# Patient Record
Sex: Male | Born: 1950 | Race: White | Hispanic: No | Smoking: Current every day smoker
Health system: Southern US, Community
[De-identification: ages and names within clinical notes are randomized; demographics above are authoritative.]

## PROBLEM LIST (undated history)

## (undated) DIAGNOSIS — G8929 Other chronic pain: Secondary | ICD-10-CM

## (undated) DIAGNOSIS — M25559 Pain in unspecified hip: Secondary | ICD-10-CM

## (undated) DIAGNOSIS — M549 Dorsalgia, unspecified: Secondary | ICD-10-CM

## (undated) DIAGNOSIS — I10 Essential (primary) hypertension: Secondary | ICD-10-CM

## (undated) DIAGNOSIS — E7800 Pure hypercholesterolemia, unspecified: Secondary | ICD-10-CM

## (undated) HISTORY — DX: Essential (primary) hypertension: I10

---

## 2011-08-26 ENCOUNTER — Encounter: Payer: Self-pay | Admitting: Orthopedic Surgery

## 2011-08-26 ENCOUNTER — Ambulatory Visit (INDEPENDENT_AMBULATORY_CARE_PROVIDER_SITE_OTHER): Payer: Self-pay | Admitting: Orthopedic Surgery

## 2011-08-26 VITALS — Resp 16 | Ht 71.0 in | Wt 186.0 lb

## 2011-08-26 DIAGNOSIS — M7650 Patellar tendinitis, unspecified knee: Secondary | ICD-10-CM

## 2011-08-26 NOTE — Patient Instructions (Signed)
Use over the counter Max freeze on knee as needed  Use at least twice a day, more if needed  Use knee pads

## 2011-08-26 NOTE — Progress Notes (Signed)
Chief complaint: Knot on the LEFT knee HPI:(80) A 60 year old male complains of sharp mild pain when he walks associated with swelling.  Symptoms are gradually no trauma it seems to be worse when he is walking.  ROS:(2) Review of systems cough heartburn and easy bleeding seasonal ALLERGIES.  History reviewed. No pertinent past surgical history. Past Medical History  Diagnosis Date  . Diabetes mellitus   . HTN (hypertension)     Physical Exam(12) GENERAL: normal development   CDV: pulses are normal   Skin: normal  Lymph: nodes were not palpable/normal  Psychiatric: awake, alert and oriented  Neuro: normal sensation  MSK LEFT knee 1 Ambulation is normal not affected 2 Inspection there is a not over the tibial tubercle with tenderness mild redness, range of motion of the knee is normal.  Muscle tone strength normal.  Knee stable.  Imaging: X-ray results, normal tibiofemoral joint space fragmented tibial tubercle   Assessment: Patellar tendinitis insertional    Plan: Observation, Max Keturah Barre  Separate x-ray report 3 views LEFT knee/reason for x-ray mass LEFT knee  Normal tibiofemoral joint, normal alignment.  Normal bone.  Separate ossicle and tibial tubercle with hypertrophy of the bone  Impression normal knee with separate tibial tubercle ossicle.

## 2013-07-03 ENCOUNTER — Encounter (HOSPITAL_COMMUNITY): Payer: Self-pay

## 2013-07-03 ENCOUNTER — Emergency Department (HOSPITAL_COMMUNITY)
Admission: EM | Admit: 2013-07-03 | Discharge: 2013-07-03 | Disposition: A | Discharge status: Home / Self Care | Payer: BC Managed Care – PPO | Attending: Emergency Medicine | Admitting: Emergency Medicine

## 2013-07-03 ENCOUNTER — Emergency Department (HOSPITAL_COMMUNITY): Payer: BC Managed Care – PPO

## 2013-07-03 DIAGNOSIS — M25559 Pain in unspecified hip: Secondary | ICD-10-CM | POA: Insufficient documentation

## 2013-07-03 DIAGNOSIS — M25552 Pain in left hip: Secondary | ICD-10-CM

## 2013-07-03 DIAGNOSIS — Z7982 Long term (current) use of aspirin: Secondary | ICD-10-CM | POA: Insufficient documentation

## 2013-07-03 DIAGNOSIS — F17200 Nicotine dependence, unspecified, uncomplicated: Secondary | ICD-10-CM | POA: Insufficient documentation

## 2013-07-03 DIAGNOSIS — E119 Type 2 diabetes mellitus without complications: Secondary | ICD-10-CM | POA: Insufficient documentation

## 2013-07-03 DIAGNOSIS — IMO0002 Reserved for concepts with insufficient information to code with codable children: Secondary | ICD-10-CM | POA: Insufficient documentation

## 2013-07-03 DIAGNOSIS — E7800 Pure hypercholesterolemia, unspecified: Secondary | ICD-10-CM | POA: Insufficient documentation

## 2013-07-03 DIAGNOSIS — Z79899 Other long term (current) drug therapy: Secondary | ICD-10-CM | POA: Insufficient documentation

## 2013-07-03 DIAGNOSIS — I10 Essential (primary) hypertension: Secondary | ICD-10-CM | POA: Insufficient documentation

## 2013-07-03 DIAGNOSIS — M5416 Radiculopathy, lumbar region: Secondary | ICD-10-CM

## 2013-07-03 HISTORY — DX: Pure hypercholesterolemia, unspecified: E78.00

## 2013-07-03 MED ORDER — NAPROXEN 500 MG PO TABS: 500.0000 mg | ORAL_TABLET | Freq: Two times a day (BID) | ORAL | Status: DC

## 2013-07-03 MED ORDER — HYDROCODONE-ACETAMINOPHEN 5-325 MG PO TABS
ORAL_TABLET | ORAL | Status: AC
Start: 2013-07-03 — End: ?

## 2013-07-03 NOTE — ED Notes (Signed)
Pt reports injury to left hip several years ago.  Reports pain started to flare up 2 or 3 days ago.  Denies new injury.

## 2013-07-05 NOTE — ED Provider Notes (Signed)
History    CSN: [id number] Arrival date & time 07/03/13  4098  First MD Initiated Contact with Patient 07/03/13 1009     Chief Complaint  Patient presents with  . Hip Pain   (Consider location/radiation/quality/duration/timing/severity/associated sxs/prior Treatment) Patient is a 62 y.o. male presenting with hip pain and back pain. The history is provided by the patient.  Hip Pain This is a new problem. The current episode started in the past 7 days. The problem occurs constantly. The problem has been gradually worsening. Associated symptoms include arthralgias. Pertinent negatives include no abdominal pain, change in bowel habit, chest pain, fever, headaches, joint swelling, neck pain, numbness, rash, sore throat, urinary symptoms, vertigo, visual change, vomiting or weakness. The symptoms are aggravated by bending, twisting, walking and standing. He has tried nothing for the symptoms. The treatment provided no relief.  Back Pain Location:  Lumbar spine Quality:  Aching Radiates to:  L thigh Pain severity:  Moderate Pain is:  Same all the time Onset quality:  Gradual Duration:  3 days Timing:  Constant Progression:  Worsening Chronicity:  Recurrent Context: twisting   Context: not recent injury   Relieved by:  Bed rest Worsened by:  Ambulation, bending, twisting, standing and movement Ineffective treatments:  NSAIDs Associated symptoms: leg pain   Associated symptoms: no abdominal pain, no abdominal swelling, no bladder incontinence, no bowel incontinence, no chest pain, no dysuria, no fever, no headaches, no numbness, no paresthesias, no pelvic pain, no perianal numbness, no tingling and no weakness    Past Medical History  Diagnosis Date  . Diabetes mellitus   . HTN (hypertension)   . Hypercholesterolemia    History reviewed. No pertinent past surgical history. Family History  Problem Relation Age of Onset  . Arthritis    . Diabetes     History  Substance Use  Topics  . Smoking status: Current Every Day Smoker -- 2.00 packs/day    Types: Cigarettes  . Smokeless tobacco: Not on file  . Alcohol Use: No    Review of Systems  Constitutional: Negative for fever.  HENT: Negative for sore throat and neck pain.   Respiratory: Negative for shortness of breath.   Cardiovascular: Negative for chest pain.  Gastrointestinal: Negative for vomiting, abdominal pain, constipation, change in bowel habit and bowel incontinence.  Genitourinary: Negative for bladder incontinence, dysuria, hematuria, flank pain, decreased urine volume, difficulty urinating and pelvic pain.       No perineal numbness or incontinence of urine or feces  Musculoskeletal: Positive for back pain and arthralgias. Negative for joint swelling.  Skin: Negative for rash.  Neurological: Negative for vertigo, tingling, weakness, numbness, headaches and paresthesias.  All other systems reviewed and are negative.    Allergies  Review of patient's allergies indicates no known allergies.  Home Medications   Current Outpatient Rx  Name  Route  Sig  Dispense  Refill  . aspirin 81 MG chewable tablet   Oral   Chew 81 mg by mouth daily.          . enalapril (VASOTEC) 20 MG tablet   Oral   Take 20 mg by mouth daily.         . hydrochlorothiazide 25 MG tablet   Oral   Take 25 mg by mouth daily.           . metFORMIN (GLUMETZA) 500 MG (MOD) 24 hr tablet   Oral   Take 500 mg by mouth 2 (two) times daily with a  meal.          . Multiple Vitamin (MULTIVITAMIN WITH MINERALS) TABS   Oral   Take 1 tablet by mouth daily.         . pravastatin (PRAVACHOL) 40 MG tablet   Oral   Take 40 mg by mouth daily.           Marland Kitchen HYDROcodone-acetaminophen (NORCO/VICODIN) 5-325 MG per tablet      Take one-two tabs po q 4-6 hrs prn pain   15 tablet   0   . naproxen (NAPROSYN) 500 MG tablet   Oral   Take 1 tablet (500 mg total) by mouth 2 (two) times daily with a meal.   20 tablet   0     BP 146/82  Pulse 63  Temp(Src) 98.4 F (36.9 C) (Oral)  Resp 16  Ht 5\' 10"  (1.778 m)  Wt 184 lb (83.462 kg)  BMI 26.4 kg/m2  SpO2 98% Physical Exam  Nursing note and vitals reviewed. Constitutional: He is oriented to person, place, and time. He appears well-developed and well-nourished. No distress.  HENT:  Head: Normocephalic and atraumatic.  Neck: Normal range of motion. Neck supple.  Cardiovascular: Normal rate, regular rhythm, normal heart sounds and intact distal pulses.   No murmur heard. Pulmonary/Chest: Effort normal and breath sounds normal. No respiratory distress.  Musculoskeletal: He exhibits tenderness. He exhibits no edema.       Lumbar back: He exhibits tenderness and pain. He exhibits normal range of motion, no swelling, no deformity, no laceration and normal pulse.  ttp of the left lumbar paraspinal muscles and SI joint.  No spinal tenderness.  DP pulses are brisk and symmetrical.  Distal sensation intact.  Hip Flexors/Extensors are intact, but pain to left hip reproduced with internal rotation.  SLR is negative bilaterally  Neurological: He is alert and oriented to person, place, and time. No cranial nerve deficit or sensory deficit. He exhibits normal muscle tone. Coordination and gait normal.  Reflex Scores:      Patellar reflexes are 2+ on the right side and 2+ on the left side.      Achilles reflexes are 2+ on the right side and 2+ on the left side. Skin: Skin is warm and dry.    ED Course  Procedures (including critical care time) Labs Reviewed - No data to display No results found.   Dg Lumbar Spine Complete  07/03/2013   *RADIOLOGY REPORT*  Clinical Data: Hip pain  LUMBAR SPINE - COMPLETE 4+ VIEW  Comparison: None.  Findings: Five views of the lumbar spine submitted.  There is disc space flattening with anterior spurring at T10-T11 and T11-T12 level.  Mild anterior spurring upper endplate of the L3 and L4 vertebral body.  Disc space flattening with mild  anterior spurring at L4-L5 and L5-S1 level.  Mild facet degenerative changes at L5 level. No acute fracture or subluxation.  IMPRESSION: No acute fracture or subluxation.  Degenerative changes as described above.   Original Report Authenticated By: Natasha Mead, M.D.   Dg Hip Complete Left  07/03/2013   *RADIOLOGY REPORT*  Clinical Data: Hip pain.  LEFT HIP - COMPLETE 2+ VIEW  Comparison: No priors.  Findings: AP view of the pelvis and AP and lateral views of the left hip demonstrate no acute displaced fracture, subluxation or dislocation.  There is mild joint space narrowing, subchondral sclerosis and osteophyte formation in the hip joints bilaterally, compatible with mild osteoarthritis.  IMPRESSION: 1.  No acute  radiographic abnormality of the bony pelvis or the left hip. 2.  Mild bilateral hip joint osteoarthritis.   Original Report Authenticated By: Trudie Reed, M.D.   1. Hip pain, left   2. Lumbar radicular pain     MDM    Patient has ttp of the left lumbar paraspinal muscles and left SI joint.  No focal neuro deficits on exam.  Ambulates with a steady gait.   Doubt emergent neurological or infectious process.    Pt agrees to rest, ice/heat and close f/u with his PMD or with orthopedics.    Mariel Lukins L. Mayvis Agudelo, PA-C 07/05/13 1313

## 2013-07-07 NOTE — ED Provider Notes (Signed)
Medical screening examination/treatment/procedure(s) were performed by non-physician practitioner and as supervising physician I was immediately available for consultation/collaboration.   Joya Gaskins, MD 07/07/13 [phone]

## 2016-07-20 ENCOUNTER — Emergency Department (HOSPITAL_COMMUNITY)
Admission: EM | Admit: 2016-07-20 | Discharge: 2016-07-20 | Disposition: A | Discharge status: Home / Self Care | Payer: BLUE CROSS/BLUE SHIELD | Attending: Emergency Medicine | Admitting: Emergency Medicine

## 2016-07-20 ENCOUNTER — Emergency Department (HOSPITAL_COMMUNITY): Payer: BLUE CROSS/BLUE SHIELD

## 2016-07-20 ENCOUNTER — Encounter (HOSPITAL_COMMUNITY): Payer: Self-pay | Admitting: Emergency Medicine

## 2016-07-20 DIAGNOSIS — I10 Essential (primary) hypertension: Secondary | ICD-10-CM | POA: Insufficient documentation

## 2016-07-20 DIAGNOSIS — S39012A Strain of muscle, fascia and tendon of lower back, initial encounter: Secondary | ICD-10-CM | POA: Diagnosis not present

## 2016-07-20 DIAGNOSIS — Y929 Unspecified place or not applicable: Secondary | ICD-10-CM | POA: Insufficient documentation

## 2016-07-20 DIAGNOSIS — F17210 Nicotine dependence, cigarettes, uncomplicated: Secondary | ICD-10-CM | POA: Diagnosis not present

## 2016-07-20 DIAGNOSIS — Y939 Activity, unspecified: Secondary | ICD-10-CM | POA: Diagnosis not present

## 2016-07-20 DIAGNOSIS — Z7984 Long term (current) use of oral hypoglycemic drugs: Secondary | ICD-10-CM | POA: Insufficient documentation

## 2016-07-20 DIAGNOSIS — Y990 Civilian activity done for income or pay: Secondary | ICD-10-CM | POA: Diagnosis not present

## 2016-07-20 DIAGNOSIS — E119 Type 2 diabetes mellitus without complications: Secondary | ICD-10-CM | POA: Insufficient documentation

## 2016-07-20 DIAGNOSIS — S3992XA Unspecified injury of lower back, initial encounter: Secondary | ICD-10-CM | POA: Diagnosis present

## 2016-07-20 DIAGNOSIS — Z7982 Long term (current) use of aspirin: Secondary | ICD-10-CM | POA: Insufficient documentation

## 2016-07-20 MED ORDER — PREDNISONE 10 MG PO TABS: ORAL_TABLET | ORAL | 0 refills | Status: DC

## 2016-07-20 MED ORDER — PREDNISONE 50 MG PO TABS
60.0000 mg | ORAL_TABLET | Freq: Once | ORAL | Status: AC
  Administered 2016-07-20: 60 mg via ORAL
  Filled 2016-07-20: qty 1

## 2016-07-20 MED ORDER — CYCLOBENZAPRINE HCL 10 MG PO TABS
10.0000 mg | ORAL_TABLET | Freq: Once | ORAL | Status: AC
  Administered 2016-07-20: 10 mg via ORAL
  Filled 2016-07-20: qty 1

## 2016-07-20 MED ORDER — CYCLOBENZAPRINE HCL 5 MG PO TABS: 5.0000 mg | ORAL_TABLET | Freq: Three times a day (TID) | ORAL | 0 refills | Status: AC | PRN

## 2016-07-20 NOTE — ED Notes (Signed)
Patient returned from X-ray 

## 2016-07-20 NOTE — Discharge Instructions (Signed)
Take your next dose of prednisone tomorrow morning.  Use the the other medicines as directed.  Continue taking your home pain medicine.   Avoid lifting,  Bending,  Twisting or any other activity that worsens your pain over the next week.  Apply an  icepack  to your lower back for 10-15 minutes every 2 hours for the next 2 days.  You should get rechecked if your symptoms are not better over the next 5 days,  Or you develop increased pain,  Weakness in your leg(s) or loss of bladder or bowel function - these are symptoms of a worse injury.

## 2016-07-20 NOTE — ED Triage Notes (Signed)
Pt reports lower back pain after crawling at his job over the weekend.  Pt states he feels like his legs are sometimes going to give out.

## 2016-07-20 NOTE — ED Notes (Signed)
Patient transported to X-ray 

## 2016-07-23 NOTE — ED Provider Notes (Signed)
[facility]PT Provider Note   CSN: [id number] Arrival date & time: 07/20/16  1108  First Provider Contact:  First MD Initiated Contact with Patient 07/20/16 1124        History   Chief Complaint Chief Complaint  Patient presents with  . Back Pain    HPI Caleb Lynch is a 65 y.o. male.  Caleb Lynch is a 65 y.o. Male presenting with worsening of his intermittent low back pain present for the past 3 days after having to crawl underneath a trailer (works [employer]) 4 days ago.  He endorses intermittent low back pain with this event worse than normal.  He has radiation of pain into his left posterior buttock and with certain movements has sharp stabs of pain that makes his legs want to buckle. He denies weakness or numbness in his legs and has had no urinary or fecal incontinence or retention.  He has no cancer history, denies substance abuse.  He has taken hydrocodone (takes for chronic c spine pain)  and rested without relief of pain.     The history is provided by the patient.    Past Medical History:  Diagnosis Date  . Diabetes mellitus   . HTN (hypertension)   . Hypercholesterolemia     Patient Active Problem List   Diagnosis Date Noted  . Patellar tendinitis 08/26/2011    History reviewed. No pertinent surgical history.     Home Medications    Prior to Admission medications   Medication Sig Start Date End Date Taking? Authorizing Provider  aspirin 81 MG chewable tablet Chew 81 mg by mouth daily.     Historical Provider, MD  cyclobenzaprine (FLEXERIL) 5 MG tablet Take 1 tablet (5 mg total) by mouth 3 (three) times daily as needed for muscle spasms. 07/20/16   Burgess Amor, PA-C  enalapril (VASOTEC) 20 MG tablet Take 20 mg by mouth daily.    Historical Provider, MD  hydrochlorothiazide 25 MG tablet Take 25 mg by mouth daily.      Historical Provider, MD  HYDROcodone-acetaminophen (NORCO/VICODIN) 5-325 MG per tablet Take one-two tabs po q 4-6 hrs  prn pain 07/03/13   Tammy Triplett, PA-C  metFORMIN (GLUMETZA) 500 MG (MOD) 24 hr tablet Take 500 mg by mouth 2 (two) times daily with a meal.     Historical Provider, MD  Multiple Vitamin (MULTIVITAMIN WITH MINERALS) TABS Take 1 tablet by mouth daily.    Historical Provider, MD  naproxen (NAPROSYN) 500 MG tablet Take 1 tablet (500 mg total) by mouth 2 (two) times daily with a meal. 07/03/13   Tammy Triplett, PA-C  pravastatin (PRAVACHOL) 40 MG tablet Take 40 mg by mouth daily.      Historical Provider, MD  predniSONE (DELTASONE) 10 MG tablet 6, 5, 4, 3, 2 then 1 tablet by mouth daily for 6 days total. 07/21/16   Burgess Amor, PA-C    Family History Family History  Problem Relation Age of Onset  . Arthritis    . Diabetes      Social History Social History  Substance Use Topics  . Smoking status: Current Every Day Smoker    Packs/day: 2.00    Types: Cigarettes  . Smokeless tobacco: Not on file  . Alcohol use No     Allergies   Review of patient's allergies indicates no known allergies.   Review of Systems Review of Systems  Constitutional: Negative for fever.  Respiratory: Negative for shortness of breath.   Cardiovascular: Negative for  chest pain and leg swelling.  Gastrointestinal: Negative for abdominal distention, abdominal pain and constipation.  Genitourinary: Negative for difficulty urinating, dysuria, flank pain, frequency and urgency.  Musculoskeletal: Positive for back pain. Negative for gait problem and joint swelling.  Skin: Negative for rash.  Neurological: Negative for weakness and numbness.     Physical Exam Updated Vital Signs BP 155/82 (BP Location: Left Arm)   Pulse 61   Temp 98.2 F (36.8 C) (Oral)   Resp 16   Ht  (1.778 m)   Wt 83 kg   SpO2 100%   BMI 26.26 kg/m   Physical Exam  Constitutional: He appears well-developed and well-nourished.  HENT:  Head: Normocephalic.  Eyes: Conjunctivae are normal.  Neck: Normal range of motion. Neck  supple.  Cardiovascular: Normal rate and intact distal pulses.   Pedal pulses normal.  Pulmonary/Chest: Effort normal.  Abdominal: Soft. Bowel sounds are normal. He exhibits no distension, no abdominal bruit and no pulsatile midline mass.  Musculoskeletal: Normal range of motion. He exhibits no edema.       Lumbar back: He exhibits tenderness. He exhibits no swelling, no edema and no spasm.  Neurological: He is alert. He has normal strength. He displays no atrophy and no tremor. No sensory deficit. Gait normal.  Reflex Scores:      Patellar reflexes are 2+ on the right side and 2+ on the left side. No strength deficit noted in hip and knee flexor and extensor muscle groups.  Ankle flexion and extension intact.  Skin: Skin is warm and dry.  Psychiatric: He has a normal mood and affect.  Nursing note and vitals reviewed.    ED Treatments / Results  Labs (all labs ordered are listed, but only abnormal results are displayed) Labs Reviewed - No data to display  EKG  EKG Interpretation None       Radiology  EXAM: LUMBAR SPINE - COMPLETE 4+ VIEW  COMPARISON: 07/03/2013  FINDINGS: There are 5 non rib-bearing lumbar type vertebral bodies. Vertebral alignment is normal. Vertebral body heights are preserved. Moderate disc space narrowing is again seen at L5-S1 with mild narrowing at T11-12 and L4-5. Moderate marginal osteophytosis is present at multiple levels. No pars defects are identified. Facet arthrosis is noted at L5-S1. Abdominal aortic atherosclerosis is present.  IMPRESSION: 1. No evidence of acute osseous abnormality. 2. Similar appearance of multilevel disc degeneration.   Electronically Signed By: Sebastian Ache M.D. On: 07/20/2016 12:35 Procedures Procedures (including critical care time)  Medications Ordered in ED Medications  predniSONE (DELTASONE) tablet 60 mg (60 mg Oral Given 07/20/16 1306)  cyclobenzaprine (FLEXERIL) tablet 10 mg (10 mg Oral Given  07/20/16 1306)     Initial Impression / Assessment and Plan / ED Course  I have reviewed the triage vital signs and the nursing notes.  Pertinent labs & imaging results that were available during my care of the patient were reviewed by me and considered in my medical decision making (see chart for details).  Clinical Course    No neuro deficit on exam or by history to suggest emergent or surgical presentation.  Imaging reviewed and discussed with patient.  Advised heat tx, continue hydrocodone, activity as tolerated, flexeril and prednisone taper given.  Also discussed worsened sx that should prompt immediate re-evaluation including distal weakness, bowel/bladder retention/incontinence.        Final Clinical Impressions(s) / ED Diagnoses   Final diagnoses:  Low back strain, initial encounter    New Prescriptions Discharge Medication  List as of 07/20/2016  1:00 PM    START taking these medications   Details  cyclobenzaprine (FLEXERIL) 5 MG tablet Take 1 tablet (5 mg total) by mouth 3 (three) times daily as needed for muscle spasms., Starting Tue 07/20/2016, Print    predniSONE (DELTASONE) 10 MG tablet 6, 5, 4, 3, 2 then 1 tablet by mouth daily for 6 days total., Print         Burgess Amor, PA-C 07/23/16 1215    Mancel Bale, MD 07/24/16 0030

## 2018-03-20 ENCOUNTER — Other Ambulatory Visit (HOSPITAL_COMMUNITY): Payer: Self-pay | Admitting: Family Medicine

## 2018-03-20 ENCOUNTER — Ambulatory Visit (HOSPITAL_COMMUNITY)
Admission: RE | Admit: 2018-03-20 | Discharge: 2018-03-20 | Disposition: A | Discharge status: Home / Self Care | Payer: Medicare Other | Source: Ambulatory Visit | Attending: Family Medicine | Admitting: Family Medicine

## 2018-03-20 DIAGNOSIS — G8929 Other chronic pain: Secondary | ICD-10-CM | POA: Diagnosis present

## 2018-03-20 DIAGNOSIS — I700 Atherosclerosis of aorta: Secondary | ICD-10-CM | POA: Insufficient documentation

## 2018-03-20 DIAGNOSIS — M5136 Other intervertebral disc degeneration, lumbar region: Secondary | ICD-10-CM | POA: Diagnosis not present

## 2018-05-17 ENCOUNTER — Other Ambulatory Visit: Payer: Self-pay | Admitting: Family Medicine

## 2018-05-17 DIAGNOSIS — M25552 Pain in left hip: Principal | ICD-10-CM

## 2018-05-17 DIAGNOSIS — M545 Low back pain: Secondary | ICD-10-CM

## 2018-05-17 DIAGNOSIS — M25551 Pain in right hip: Secondary | ICD-10-CM

## 2018-05-21 ENCOUNTER — Ambulatory Visit
Admission: RE | Admit: 2018-05-21 | Discharge: 2018-05-21 | Disposition: A | Discharge status: Home / Self Care | Payer: Medicare Other | Source: Ambulatory Visit | Attending: Family Medicine | Admitting: Family Medicine

## 2018-05-21 DIAGNOSIS — M545 Low back pain: Secondary | ICD-10-CM

## 2018-05-21 DIAGNOSIS — M25551 Pain in right hip: Secondary | ICD-10-CM

## 2018-05-21 DIAGNOSIS — M25552 Pain in left hip: Principal | ICD-10-CM

## 2018-10-21 ENCOUNTER — Emergency Department (HOSPITAL_COMMUNITY): Payer: Medicare Other

## 2018-10-21 ENCOUNTER — Other Ambulatory Visit: Payer: Self-pay

## 2018-10-21 ENCOUNTER — Inpatient Hospital Stay (HOSPITAL_COMMUNITY)
Admission: EM | Admit: 2018-10-21 | Discharge: 2018-10-24 | DRG: 351 | Disposition: A | Discharge status: Home / Self Care | Payer: Medicare Other | Attending: Nephrology | Admitting: Nephrology

## 2018-10-21 ENCOUNTER — Encounter (HOSPITAL_COMMUNITY): Payer: Self-pay | Admitting: Emergency Medicine

## 2018-10-21 DIAGNOSIS — Z720 Tobacco use: Secondary | ICD-10-CM | POA: Diagnosis not present

## 2018-10-21 DIAGNOSIS — Z79899 Other long term (current) drug therapy: Secondary | ICD-10-CM

## 2018-10-21 DIAGNOSIS — N2889 Other specified disorders of kidney and ureter: Secondary | ICD-10-CM | POA: Diagnosis not present

## 2018-10-21 DIAGNOSIS — N12 Tubulo-interstitial nephritis, not specified as acute or chronic: Secondary | ICD-10-CM | POA: Diagnosis present

## 2018-10-21 DIAGNOSIS — E7800 Pure hypercholesterolemia, unspecified: Secondary | ICD-10-CM | POA: Diagnosis present

## 2018-10-21 DIAGNOSIS — R93429 Abnormal radiologic findings on diagnostic imaging of unspecified kidney: Secondary | ICD-10-CM | POA: Diagnosis not present

## 2018-10-21 DIAGNOSIS — I10 Essential (primary) hypertension: Secondary | ICD-10-CM | POA: Diagnosis present

## 2018-10-21 DIAGNOSIS — K4030 Unilateral inguinal hernia, with obstruction, without gangrene, not specified as recurrent: Secondary | ICD-10-CM | POA: Diagnosis present

## 2018-10-21 DIAGNOSIS — F17210 Nicotine dependence, cigarettes, uncomplicated: Secondary | ICD-10-CM | POA: Diagnosis present

## 2018-10-21 DIAGNOSIS — Z7984 Long term (current) use of oral hypoglycemic drugs: Secondary | ICD-10-CM

## 2018-10-21 DIAGNOSIS — Z7982 Long term (current) use of aspirin: Secondary | ICD-10-CM | POA: Diagnosis not present

## 2018-10-21 DIAGNOSIS — E278 Other specified disorders of adrenal gland: Secondary | ICD-10-CM | POA: Diagnosis not present

## 2018-10-21 DIAGNOSIS — E119 Type 2 diabetes mellitus without complications: Secondary | ICD-10-CM | POA: Diagnosis present

## 2018-10-21 DIAGNOSIS — E876 Hypokalemia: Secondary | ICD-10-CM | POA: Diagnosis present

## 2018-10-21 HISTORY — DX: Other chronic pain: G89.29

## 2018-10-21 HISTORY — DX: Pain in unspecified hip: M25.559

## 2018-10-21 HISTORY — DX: Dorsalgia, unspecified: M54.9

## 2018-10-21 LAB — CBC
HCT: 41.3 % (ref 39.0–52.0)
Hemoglobin: 13.2 g/dL (ref 13.0–17.0)
MCH: 29 pg (ref 26.0–34.0)
MCHC: 32 g/dL (ref 30.0–36.0)
MCV: 90.8 fL (ref 80.0–100.0)
PLATELETS: 558 10*3/uL — AB (ref 150–400)
RBC: 4.55 MIL/uL (ref 4.22–5.81)
RDW: 13.1 % (ref 11.5–15.5)
WBC: 21.8 10*3/uL — ABNORMAL HIGH (ref 4.0–10.5)
nRBC: 0 % (ref 0.0–0.2)

## 2018-10-21 LAB — URINALYSIS, ROUTINE W REFLEX MICROSCOPIC
Bacteria, UA: NONE SEEN
Bilirubin Urine: NEGATIVE
GLUCOSE, UA: NEGATIVE mg/dL
Hgb urine dipstick: NEGATIVE
Ketones, ur: NEGATIVE mg/dL
Nitrite: NEGATIVE
PROTEIN: NEGATIVE mg/dL
Specific Gravity, Urine: 1.03 (ref 1.005–1.030)
pH: 6 (ref 5.0–8.0)

## 2018-10-21 LAB — COMPREHENSIVE METABOLIC PANEL
ALBUMIN: 3.1 g/dL — AB (ref 3.5–5.0)
ALK PHOS: 69 U/L (ref 38–126)
ALT: 16 U/L (ref 0–44)
AST: 15 U/L (ref 15–41)
Anion gap: 11 (ref 5–15)
BUN: 22 mg/dL (ref 8–23)
CALCIUM: 8.7 mg/dL — AB (ref 8.9–10.3)
CO2: 22 mmol/L (ref 22–32)
CREATININE: 1.07 mg/dL (ref 0.61–1.24)
Chloride: 102 mmol/L (ref 98–111)
GFR calc Af Amer: >60 mL/min (ref >60)
GFR calc non Af Amer: >60 mL/min (ref >60)
GLUCOSE: 131 mg/dL — AB (ref 70–99)
Potassium: 3 mmol/L — ABNORMAL LOW (ref 3.5–5.1)
SODIUM: 135 mmol/L (ref 135–145)
Total Bilirubin: 0.4 mg/dL (ref 0.3–1.2)
Total Protein: 6.4 g/dL — ABNORMAL LOW (ref 6.5–8.1)

## 2018-10-21 LAB — DIFFERENTIAL
ABS IMMATURE GRANULOCYTES: 0.14 10*3/uL — AB (ref 0.00–0.07)
Basophils Absolute: 0 10*3/uL (ref 0.0–0.1)
Basophils Relative: 0 %
EOS PCT: 0 %
Eosinophils Absolute: 0 10*3/uL (ref 0.0–0.5)
Immature Granulocytes: 1 %
LYMPHS PCT: 6 %
Lymphs Abs: 1.2 10*3/uL (ref 0.7–4.0)
MONOS PCT: 4 %
Monocytes Absolute: 0.9 10*3/uL (ref 0.1–1.0)
NEUTROS ABS: 19 10*3/uL — AB (ref 1.7–7.7)
Neutrophils Relative %: 89 %

## 2018-10-21 LAB — GLUCOSE, CAPILLARY
Glucose-Capillary: 106 mg/dL — ABNORMAL HIGH (ref 70–99)
Glucose-Capillary: 93 mg/dL (ref 70–99)

## 2018-10-21 LAB — LIPASE, BLOOD: Lipase: 21 U/L (ref 11–51)

## 2018-10-21 MED ORDER — KETOROLAC TROMETHAMINE 15 MG/ML IJ SOLN
15.0000 mg | Freq: Four times a day (QID) | INTRAMUSCULAR | Status: DC | PRN
  Administered 2018-10-21 – 2018-10-22 (×5): 15 mg via INTRAVENOUS
  Filled 2018-10-21 (×5): qty 1

## 2018-10-21 MED ORDER — ONDANSETRON HCL 4 MG PO TABS: 4.0000 mg | ORAL_TABLET | Freq: Four times a day (QID) | ORAL | Status: DC | PRN

## 2018-10-21 MED ORDER — PIPERACILLIN-TAZOBACTAM 3.375 G IVPB
3.3750 g | Freq: Three times a day (TID) | INTRAVENOUS | Status: DC
  Administered 2018-10-21 – 2018-10-24 (×8): 3.375 g via INTRAVENOUS
  Filled 2018-10-21 (×8): qty 50

## 2018-10-21 MED ORDER — ACETAMINOPHEN 650 MG RE SUPP: 650.0000 mg | Freq: Four times a day (QID) | RECTAL | Status: DC | PRN

## 2018-10-21 MED ORDER — ONDANSETRON HCL 4 MG/2ML IJ SOLN: 4.0000 mg | Freq: Four times a day (QID) | INTRAMUSCULAR | Status: DC | PRN

## 2018-10-21 MED ORDER — ENOXAPARIN SODIUM 40 MG/0.4ML ~~LOC~~ SOLN
40.0000 mg | SUBCUTANEOUS | Status: DC
  Administered 2018-10-21: 40 mg via SUBCUTANEOUS
  Filled 2018-10-21: qty 0.4

## 2018-10-21 MED ORDER — ACETAMINOPHEN 325 MG PO TABS: 650.0000 mg | ORAL_TABLET | Freq: Four times a day (QID) | ORAL | Status: DC | PRN

## 2018-10-21 MED ORDER — SODIUM CHLORIDE 0.9 % IV BOLUS: 1000.0000 mL | Freq: Once | INTRAVENOUS | Status: DC

## 2018-10-21 MED ORDER — POTASSIUM CHLORIDE IN NACL 40-0.9 MEQ/L-% IV SOLN
INTRAVENOUS | Status: DC
  Administered 2018-10-21 – 2018-10-22 (×2): 100 mL/h via INTRAVENOUS
  Administered 2018-10-22 – 2018-10-23 (×2): 75 mL/h via INTRAVENOUS

## 2018-10-21 MED ORDER — ONDANSETRON HCL 4 MG/2ML IJ SOLN
4.0000 mg | Freq: Once | INTRAMUSCULAR | Status: AC | PRN
  Administered 2018-10-21: 4 mg via INTRAVENOUS
  Filled 2018-10-21: qty 2

## 2018-10-21 MED ORDER — SODIUM CHLORIDE 0.9 % IV BOLUS
1000.0000 mL | Freq: Once | INTRAVENOUS | Status: AC
  Administered 2018-10-21: 1000 mL via INTRAVENOUS

## 2018-10-21 MED ORDER — INSULIN ASPART 100 UNIT/ML ~~LOC~~ SOLN: 0.0000 [IU] | SUBCUTANEOUS | Status: DC

## 2018-10-21 MED ORDER — PIPERACILLIN-TAZOBACTAM 3.375 G IVPB 30 MIN
3.3750 g | Freq: Once | INTRAVENOUS | Status: AC
  Administered 2018-10-21: 3.375 g via INTRAVENOUS
  Filled 2018-10-21: qty 50

## 2018-10-21 MED ORDER — HYDRALAZINE HCL 20 MG/ML IJ SOLN: 10.0000 mg | Freq: Four times a day (QID) | INTRAMUSCULAR | Status: DC | PRN

## 2018-10-21 MED ORDER — IOPAMIDOL (ISOVUE-300) INJECTION 61%
100.0000 mL | Freq: Once | INTRAVENOUS | Status: AC | PRN
  Administered 2018-10-21: 100 mL via INTRAVENOUS

## 2018-10-21 NOTE — Progress Notes (Signed)
Pharmacy Antibiotic Note  Caleb Lynch is a 67 y.o. male admitted on 10/21/2018 with  intra-abdominal infection.  Pharmacy has been consulted for Zosyn dosing.   Plan: Start Zosyn 3.375g IV q8h (4-hr infusion)  Height: 5\' 10"  (177.8 cm) Weight: 156 lb (70.8 kg) IBW/kg (Calculated) : 73  Temp (24hrs), Avg:98.2 F (36.8 C), Min:98.2 F (36.8 C), Max:98.2 F (36.8 C)  Recent Labs  Lab 10/21/18 1127  WBC 21.8*  CREATININE 1.07    Estimated Creatinine Clearance: 68 mL/min (by C-G formula based on SCr of 1.07 mg/dL).    No Known Allergies  Antimicrobials this admission:   Zosyn   11/2 >>       Microbiology results:  11/2 UCx: in progress    Thank you for allowing pharmacy to be a part of this patient's care.  Tama High 10/21/2018 6:46 PM

## 2018-10-21 NOTE — H&P (Signed)
History and Physical    Caleb Lynch ZOX:[id number] DOB: 1951/08/17 DOA: 10/21/2018  PCP: Oval Linsey, MD  Patient coming from: Home  I have personally briefly reviewed patient's old medical records in [facility]  Chief Complaint: Vomiting  HPI: Caleb Lynch is a 67 y.o. male with medical history significant of hypertension, diabetes, left inguinal hernia, who is been feeling nauseous and vomiting intermittently for the past several weeks.  For the past 3 to 4 days, he has had persistent vomiting.  He is been able to keep anything down by mouth.  He basically thought was up everything that he eats.  His last bowel movement was several days ago.  He is passing flatus.  He has not had any fever.  No shortness of breath or cough.  No dysuria.  No flank pain.  He does describe some discomfort in his lower abdomen.  He denies any previous abdominal surgeries.  ED Course: Patient was evaluated in the emergency room for vitals were noted to be stable.  WBC count noted to be elevated at 21.  Urinalysis shows 11-20 WBCs with no bacteria.  CT scan of the abdomen and pelvis indicated high-grade mechanical obstruction of the descending colon secondary to entering and exiting left inguinal hernia.  Obstruction occurs within the hernia sac.  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  Review of Systems: As per HPI otherwise 10 point review of systems negative.    Past Medical History:  Diagnosis Date  . Chronic back pain   . Chronic hip pain   . Diabetes mellitus   . HTN (hypertension)   . Hypercholesterolemia     History reviewed. No pertinent surgical history.  Social History:  reports that he has been smoking cigarettes. He has a 40.00 pack-year smoking history. He has never used smokeless tobacco. He reports that he  does not drink alcohol or use drugs.  No Known Allergies  Family History  Problem Relation Age of Onset  . Arthritis Unknown   . Diabetes Unknown     Prior to Admission medications   Medication Sig Start Date End Date Taking? Authorizing Provider  amLODipine (NORVASC) 10 MG tablet Take 1 tablet by mouth daily. 08/19/18  Yes [provider]  aspirin 81 MG chewable tablet Chew 81 mg by mouth daily.    Yes [provider]  HYDROcodone-acetaminophen (NORCO/VICODIN) 5-325 MG per tablet Take one-two tabs po q 4-6 hrs prn pain 07/03/13  Yes Triplett, Tammy, PA-C  lisinopril (PRINIVIL,ZESTRIL) 20 MG tablet Take 1 tablet by mouth daily. 10/07/18  Yes [provider]  metFORMIN (GLUMETZA) 500 MG (MOD) 24 hr tablet Take 500 mg by mouth 2 (two) times daily with a meal.    Yes [provider]  Multiple Vitamin (MULTIVITAMIN WITH MINERALS) TABS Take 1 tablet by mouth daily.   Yes [provider]  naproxen (NAPROSYN) 500 MG tablet Take 1 tablet (500 mg total) by mouth 2 (two) times daily with a meal. 07/03/13  Yes Triplett, Tammy, PA-C  pravastatin (PRAVACHOL) 40 MG tablet Take 40 mg by mouth daily.     Yes [provider]  cyclobenzaprine (FLEXERIL) 5 MG tablet Take 1 tablet (5 mg total) by mouth 3 (three) times daily as needed for muscle spasms. Patient not taking: Reported on 10/21/2018 07/20/16   Burgess Amor, PA-C  enalapril (VASOTEC)  20 MG tablet Take 20 mg by mouth daily.    [provider]  predniSONE (DELTASONE) 10 MG tablet 6, 5, 4, 3, 2 then 1 tablet by mouth daily for 6 days total. Patient not taking: Reported on 10/21/2018 07/21/16   Burgess Amor, PA-C    Physical Exam: Vitals:   10/21/18 1056 10/21/18 1603  BP: (!) 153/96 (!) 148/91  Pulse: 83 78  Resp: 14 16  Temp: 98.2 F (36.8 C) 98.2 F (36.8 C)  TempSrc: Oral Oral  SpO2: 98% 95%  Weight: 78.9 kg 70.8 kg  Height: 5\' 10"  (1.778 m) 5\' 10"  (1.778 m)    Constitutional: NAD,  calm, comfortable Eyes: PERRL, lids and conjunctivae normal ENMT: Mucous membranes are moist. Posterior pharynx clear of any exudate or lesions.Normal dentition.  Neck: normal, supple, no masses, no thyromegaly Respiratory: clear to auscultation bilaterally, no wheezing, no crackles. Normal respiratory effort. No accessory muscle use.  Cardiovascular: Regular rate and rhythm, no murmurs / rubs / gallops. No extremity edema. 2+ pedal pulses. No carotid bruits.  Abdomen: no tenderness, no masses palpated. No hepatosplenomegaly. Bowel sounds positive.  Left inguinal hernia, not reducible, mildly tender on palpation Musculoskeletal: no clubbing / cyanosis. No joint deformity upper and lower extremities. Good ROM, no contractures. Normal muscle tone.  Skin: no rashes, lesions, ulcers. No induration Neurologic: CN 2-12 grossly intact. Sensation intact, DTR normal. Strength 5/5 in all 4.  Psychiatric: Normal judgment and insight. Alert and oriented x 3. Normal mood.    Labs on Admission: I have personally reviewed following labs and imaging studies  CBC: Recent Labs  Lab 10/21/18 1127 10/21/18 1129  WBC 21.8*  --   NEUTROABS  --  19.0*  HGB 13.2  --   HCT 41.3  --   MCV 90.8  --   PLT 558*  --    Basic Metabolic Panel: Recent Labs  Lab 10/21/18 1127  NA 135  K 3.0*  CL 102  CO2 22  GLUCOSE 131*  BUN 22  CREATININE 1.07  CALCIUM 8.7*   GFR: Estimated Creatinine Clearance: 68 mL/min (by C-G formula based on SCr of 1.07 mg/dL). Liver Function Tests: Recent Labs  Lab 10/21/18 1127  AST 15  ALT 16  ALKPHOS 69  BILITOT 0.4  PROT 6.4*  ALBUMIN 3.1*   Recent Labs  Lab 10/21/18 1127  LIPASE 21   No results for input(s): AMMONIA in the last 168 hours. Coagulation Profile: No results for input(s): INR, PROTIME in the last 168 hours. Cardiac Enzymes: No results for input(s): CKTOTAL, CKMB, CKMBINDEX, TROPONINI in the last 168 hours. BNP (last 3 results) No results for  input(s): PROBNP in the last 8760 hours. HbA1C: No results for input(s): HGBA1C in the last 72 hours. CBG: Recent Labs  Lab 10/21/18 1640  GLUCAP 106*   Lipid Profile: No results for input(s): CHOL, HDL, LDLCALC, TRIG, CHOLHDL, LDLDIRECT in the last 72 hours. Thyroid Function Tests: No results for input(s): TSH, T4TOTAL, FREET4, T3FREE, THYROIDAB in the last 72 hours. Anemia Panel: No results for input(s): VITAMINB12, FOLATE, FERRITIN, TIBC, IRON, RETICCTPCT in the last 72 hours. Urine analysis:    Component Value Date/Time   COLORURINE YELLOW 10/21/2018 1503   APPEARANCEUR CLEAR 10/21/2018 1503   LABSPEC 1.030 10/21/2018 1503   PHURINE 6.0 10/21/2018 1503   GLUCOSEU NEGATIVE 10/21/2018 1503   HGBUR NEGATIVE 10/21/2018 1503   BILIRUBINUR NEGATIVE 10/21/2018 1503   KETONESUR NEGATIVE 10/21/2018 1503   PROTEINUR NEGATIVE 10/21/2018 1503  NITRITE NEGATIVE 10/21/2018 1503   LEUKOCYTESUR TRACE (A) 10/21/2018 1503    Radiological Exams on Admission: Ct Abdomen Pelvis W Contrast  Result Date: 10/21/2018 CLINICAL DATA:  Nausea and vomiting for 1 week.  Weakness fatigue. EXAM: CT ABDOMEN AND PELVIS WITH CONTRAST TECHNIQUE: Multidetector CT imaging of the abdomen and pelvis was performed using the standard protocol following bolus administration of intravenous contrast. CONTRAST:  ISOVUE-300 IOPAMIDOL (ISOVUE-300) INJECTION 61% COMPARISON:  None. FINDINGS: Lower chest: Lung bases are clear. Hepatobiliary: No focal hepatic lesion. No biliary duct dilatation. Gallbladder is normal. Common bile duct is normal. Pancreas: Pancreas is normal. No ductal dilatation. No pancreatic inflammation. Spleen: Normal spleen Adrenals/urinary tract: Nodular thickening of the LEFT adrenal gland. The lateral limb of the LEFT adrenal gland measures 10 mm. The medial limb measures 12 mm. RIGHT adrenal gland normal. There is ill-defined rounded region of hypoperfusion in the upper pole of the LEFT kidney  measuring 2.4 cm (image 25/2). Second lesion in the posterior more superior aspect of the upper pole LEFT kidney is similar measuring 2.2 cm (image 22/2). Benign cyst in the LEFT renal hilum. There is a region of hypoperfusion in the upper pole of the RIGHT kidney which less well-defined (image 27/2. Potential region of hypoperfusion in the lower pole of the RIGHT kidney Stomach/Bowel: Stomach, duodenum and small bowel are normal. The cecum is fluid-filled. Appendix is not identified. There is pericolonic inflammation along the ascending colon. The transverse colon is fluid-filled. The descending colon is distended and fluid-filled. The descending colon enters LEFT inguinal hernia. Proximal to the descending colon entering the hernia the diameter measures 5.3 cm. Upon exiting the LEFT inguinal hernia, the bowel diameter is 1.6 cm and collapsed. The more distal sigmoid colon and rectum are near complete collapse. Findings are consistent with high-grade mechanical obstruction of the descending colon upon entering the LEFT inguinal hernia. Vascular/Lymphatic: Abdominal aorta is normal caliber with atherosclerotic calcification. There is no retroperitoneal or periportal lymphadenopathy. No pelvic lymphadenopathy. Reproductive: Prostate normal Other: Small free fluid the pelvis. Musculoskeletal: No aggressive osseous lesion. IMPRESSION: 1. High-grade mechanical obstruction of the descending secondary to entering and exiting a LEFT inguinal hernia. Obstruction occurs within the hernia sac. 2. Fluid-filled colon from the level of LEFT inguinal obstruction back through to the cecum. There is inflammation along the ascending colon. No perforation or pneumatosis. 3. Free fluid in the pelvis related to obstruction. 4. There bilateral regions of hypoperfusion in LEFT and RIGHT kidney. The regions in the LEFT kidney are more well-defined. Favor multifocal bilateral pyelonephritis over LEFT renal neoplasm. Recommend correlation  with urinalysis and leukocytosis. 5. Nodular thickening of the LEFT adrenal gland is indeterminate. Recommend adrenal protocol MRI at some point to evaluate these LEFT adrenal lesions. Findings conveyed toJOSEPH ZAMMIT on 10/21/2018  at14:50. Electronically Signed   By: Genevive Bi M.D.   On: 10/21/2018 14:50   Dg Abd Acute W/chest  Result Date: 10/21/2018 CLINICAL DATA:  Vomiting for the past 3-4 weeks. EXAM: DG ABDOMEN ACUTE W/ 1V CHEST COMPARISON:  None. FINDINGS: The heart size and mediastinal contours are within normal limits. Normal pulmonary vascularity. Chronic bronchitic changes, likely smoking-related. Small focal opacity at the peripheral right lung base with adjacent linear scarring. Mild dilatation of the transverse, descending, and sigmoid colon. Air-fluid levels seen in the transverse and descending colon. No small bowel dilatation. There is no evidence of free intraperitoneal air. No radiopaque calculi or other significant radiographic abnormality is seen. No acute osseous abnormality. IMPRESSION:  1. Mildly dilated transverse and left colon with air-fluid levels, concerning for large bowel obstruction. Recommend CT of the abdomen pelvis with contrast for further evaluation. 2. Small focal opacity in the peripheral right lung base is favored to reflect scarring/atelectasis. Electronically Signed   By: Obie Dredge M.D.   On: 10/21/2018 11:49    Assessment/Plan Active Problems:   Inguinal hernia of left side with obstruction   HTN (hypertension)   DM type 2 (diabetes mellitus, type 2) (HCC)   Tobacco abuse   Hypokalemia     1. Inguinal hernia of left side with obstruction of descending colon.  Patient has incarcerated hernia.  Case discussed with Dr. Lovell Sheehan and will likely need operative management.  He will see the patient in the morning.  Start on intravenous Zosyn.  We will keep n.p.o. except for ice chips for now.  He is not vomiting.  If he does develop vomiting, he will  need NG tube decompression. 2. Diabetes.  Hold oral agents.  Start on sliding scale insulin. 3. Hypertension.  Oral agents currently on hold.  Will use PRN hydralazine. 4. Hypokalemia.  Replace.  Check magnesium. 5. Abnormal imaging of kidneys.  Concern for underlying infection/pyelonephritis, although urinalysis does not really support infection.  He does not have any dysuria or flank pain.  Urine culture in process.  More likely, he would benefit from further imaging including MRI abdomen to further evaluate his kidneys when service is available.  DVT prophylaxis: Lovenox Code Status: Full code Family Communication: Discussed with several family members at the bedside Disposition Plan: Discharge home once improved Consults called: General surgery Admission status: Inpatient, MedSurg  Erick Blinks MD [facility] Pager [phone]  If 7PM-7AM, please contact night-coverage [email or web address] Password [facility]  10/21/2018, 6:25 PM

## 2018-10-21 NOTE — ED Triage Notes (Signed)
Patient c/o nausea and vomiting x1 week. Per patient started feeling generalized weakness/fatigue x3 weeks. Denies any abd pain, diarrhea, or fevers.

## 2018-10-21 NOTE — ED Provider Notes (Signed)
[facility] EMERGENCY DEPARTMENT Provider Note   CSN: [id number] Arrival date & time: 10/21/18  1022     History   Chief Complaint Chief Complaint  Patient presents with  . Emesis    HPI Caleb Lynch is a 67 y.o. male.  Patient complains of vomiting for 3 weeks.  The history is provided by the patient. No language interpreter was used.  Emesis   This is a new problem. The current episode started more than 1 week ago. The problem occurs 2 to 4 times per day. The problem has not changed since onset.The emesis has an appearance of stomach contents. There has been no fever. Pertinent negatives include no abdominal pain, no chills, no cough, no diarrhea and no headaches.    Past Medical History:  Diagnosis Date  . Chronic back pain   . Chronic hip pain   . Diabetes mellitus   . HTN (hypertension)   . Hypercholesterolemia     Patient Active Problem List   Diagnosis Date Noted  . Inguinal hernia of left side with obstruction 10/21/2018  . Patellar tendinitis 08/26/2011    History reviewed. No pertinent surgical history.      Home Medications    Prior to Admission medications   Medication Sig Start Date End Date Taking? Authorizing Provider  amLODipine (NORVASC) 10 MG tablet Take 1 tablet by mouth daily. 08/19/18  Yes [provider]  aspirin 81 MG chewable tablet Chew 81 mg by mouth daily.    Yes [provider]  HYDROcodone-acetaminophen (NORCO/VICODIN) 5-325 MG per tablet Take one-two tabs po q 4-6 hrs prn pain 07/03/13  Yes Triplett, Tammy, PA-C  levofloxacin (LEVAQUIN) 500 MG tablet TK 1 T PO  QD FOR 7 DAYS 10/12/18  Yes [provider]  lisinopril (PRINIVIL,ZESTRIL) 20 MG tablet Take 1 tablet by mouth daily. 10/07/18  Yes [provider]  metFORMIN (GLUMETZA) 500 MG (MOD) 24 hr tablet Take 500 mg by mouth 2 (two) times daily with a meal.    Yes [provider]  Multiple Vitamin (MULTIVITAMIN WITH MINERALS) TABS Take 1  tablet by mouth daily.   Yes [provider]  naproxen (NAPROSYN) 500 MG tablet Take 1 tablet (500 mg total) by mouth 2 (two) times daily with a meal. 07/03/13  Yes Triplett, Tammy, PA-C  pravastatin (PRAVACHOL) 40 MG tablet Take 40 mg by mouth daily.     Yes [provider]  cyclobenzaprine (FLEXERIL) 5 MG tablet Take 1 tablet (5 mg total) by mouth 3 (three) times daily as needed for muscle spasms. Patient not taking: Reported on 10/21/2018 07/20/16   Burgess Amor, PA-C  enalapril (VASOTEC) 20 MG tablet Take 20 mg by mouth daily.    [provider]  predniSONE (DELTASONE) 10 MG tablet 6, 5, 4, 3, 2 then 1 tablet by mouth daily for 6 days total. Patient not taking: Reported on 10/21/2018 07/21/16   Burgess Amor, PA-C    Family History Family History  Problem Relation Age of Onset  . Arthritis Unknown   . Diabetes Unknown     Social History Social History   Tobacco Use  . Smoking status: Current Every Day Smoker    Packs/day: 1.00    Years: 40.00    Pack years: 40.00    Types: Cigarettes  . Smokeless tobacco: Never Used  Substance Use Topics  . Alcohol use: No  . Drug use: No     Allergies   Patient has no known allergies.  Review of Systems Review of Systems  Constitutional: Negative for appetite change, chills and fatigue.  HENT: Negative for congestion, ear discharge and sinus pressure.   Eyes: Negative for discharge.  Respiratory: Negative for cough.   Cardiovascular: Negative for chest pain.  Gastrointestinal: Positive for vomiting. Negative for abdominal pain and diarrhea.  Genitourinary: Negative for frequency and hematuria.  Musculoskeletal: Negative for back pain.  Skin: Negative for rash.  Neurological: Negative for seizures and headaches.  Psychiatric/Behavioral: Negative for hallucinations.     Physical Exam Updated Vital Signs BP (!) 153/96 (BP Location: Left Arm)   Pulse 83   Temp 98.2 F (36.8 C) (Oral)   Resp 14   Ht 5\' 10"   (1.778 m)   Wt 78.9 kg   SpO2 98%   BMI 24.97 kg/m   Physical Exam  Constitutional: He is oriented to person, place, and time. He appears well-developed.  HENT:  Head: Normocephalic.  Eyes: Conjunctivae and EOM are normal. No scleral icterus.  Neck: Neck supple. No thyromegaly present.  Cardiovascular: Normal rate and regular rhythm. Exam reveals no gallop and no friction rub.  No murmur heard. Pulmonary/Chest: No stridor. He has no wheezes. He has no rales. He exhibits no tenderness.  Abdominal: He exhibits no distension. There is no tenderness. There is no rebound.  Nonreducible inguinal hernia  Musculoskeletal: Normal range of motion. He exhibits no edema.  Lymphadenopathy:    He has no cervical adenopathy.  Neurological: He is oriented to person, place, and time. He exhibits normal muscle tone. Coordination normal.  Skin: No rash noted. No erythema.  Psychiatric: He has a normal mood and affect. His behavior is normal.     ED Treatments / Results  Labs (all labs ordered are listed, but only abnormal results are displayed) Labs Reviewed  COMPREHENSIVE METABOLIC PANEL - Abnormal; Notable for the following components:      Result Value   Potassium 3.0 (*)    Glucose, Bld 131 (*)    Calcium 8.7 (*)    Total Protein 6.4 (*)    Albumin 3.1 (*)    All other components within normal limits  CBC - Abnormal; Notable for the following components:   WBC 21.8 (*)    Platelets 558 (*)    All other components within normal limits  DIFFERENTIAL - Abnormal; Notable for the following components:   Neutro Abs 19.0 (*)    Abs Immature Granulocytes 0.14 (*)    All other components within normal limits  URINE CULTURE  LIPASE, BLOOD  URINALYSIS, ROUTINE W REFLEX MICROSCOPIC    EKG None  Radiology Ct Abdomen Pelvis W Contrast  Result Date: 10/21/2018 CLINICAL DATA:  Nausea and vomiting for 1 week.  Weakness fatigue. EXAM: CT ABDOMEN AND PELVIS WITH CONTRAST TECHNIQUE:  Multidetector CT imaging of the abdomen and pelvis was performed using the standard protocol following bolus administration of intravenous contrast. CONTRAST:  ISOVUE-300 IOPAMIDOL (ISOVUE-300) INJECTION 61% COMPARISON:  None. FINDINGS: Lower chest: Lung bases are clear. Hepatobiliary: No focal hepatic lesion. No biliary duct dilatation. Gallbladder is normal. Common bile duct is normal. Pancreas: Pancreas is normal. No ductal dilatation. No pancreatic inflammation. Spleen: Normal spleen Adrenals/urinary tract: Nodular thickening of the LEFT adrenal gland. The lateral limb of the LEFT adrenal gland measures 10 mm. The medial limb measures 12 mm. RIGHT adrenal gland normal. There is ill-defined rounded region of hypoperfusion in the upper pole of the LEFT kidney measuring 2.4 cm (image 25/2). Second lesion in the posterior  more superior aspect of the upper pole LEFT kidney is similar measuring 2.2 cm (image 22/2). Benign cyst in the LEFT renal hilum. There is a region of hypoperfusion in the upper pole of the RIGHT kidney which less well-defined (image 27/2. Potential region of hypoperfusion in the lower pole of the RIGHT kidney Stomach/Bowel: Stomach, duodenum and small bowel are normal. The cecum is fluid-filled. Appendix is not identified. There is pericolonic inflammation along the ascending colon. The transverse colon is fluid-filled. The descending colon is distended and fluid-filled. The descending colon enters LEFT inguinal hernia. Proximal to the descending colon entering the hernia the diameter measures 5.3 cm. Upon exiting the LEFT inguinal hernia, the bowel diameter is 1.6 cm and collapsed. The more distal sigmoid colon and rectum are near complete collapse. Findings are consistent with high-grade mechanical obstruction of the descending colon upon entering the LEFT inguinal hernia. Vascular/Lymphatic: Abdominal aorta is normal caliber with atherosclerotic calcification. There is no retroperitoneal  or periportal lymphadenopathy. No pelvic lymphadenopathy. Reproductive: Prostate normal Other: Small free fluid the pelvis. Musculoskeletal: No aggressive osseous lesion. IMPRESSION: 1. High-grade mechanical obstruction of the descending secondary to entering and exiting a LEFT inguinal hernia. Obstruction occurs within the hernia sac. 2. Fluid-filled colon from the level of LEFT inguinal obstruction back through to the cecum. There is inflammation along the ascending colon. No perforation or pneumatosis. 3. Free fluid in the pelvis related to obstruction. 4. There bilateral regions of hypoperfusion in LEFT and RIGHT kidney. The regions in the LEFT kidney are more well-defined. Favor multifocal bilateral pyelonephritis over LEFT renal neoplasm. Recommend correlation with urinalysis and leukocytosis. 5. Nodular thickening of the LEFT adrenal gland is indeterminate. Recommend adrenal protocol MRI at some point to evaluate these LEFT adrenal lesions. Findings conveyed toJOSEPH Dawnell Bryant on 10/21/2018  at14:50. Electronically Signed   By: Genevive Bi M.D.   On: 10/21/2018 14:50   Dg Abd Acute W/chest  Result Date: 10/21/2018 CLINICAL DATA:  Vomiting for the past 3-4 weeks. EXAM: DG ABDOMEN ACUTE W/ 1V CHEST COMPARISON:  None. FINDINGS: The heart size and mediastinal contours are within normal limits. Normal pulmonary vascularity. Chronic bronchitic changes, likely smoking-related. Small focal opacity at the peripheral right lung base with adjacent linear scarring. Mild dilatation of the transverse, descending, and sigmoid colon. Air-fluid levels seen in the transverse and descending colon. No small bowel dilatation. There is no evidence of free intraperitoneal air. No radiopaque calculi or other significant radiographic abnormality is seen. No acute osseous abnormality. IMPRESSION: 1. Mildly dilated transverse and left colon with air-fluid levels, concerning for large bowel obstruction. Recommend CT of the abdomen  pelvis with contrast for further evaluation. 2. Small focal opacity in the peripheral right lung base is favored to reflect scarring/atelectasis. Electronically Signed   By: Obie Dredge M.D.   On: 10/21/2018 11:49    Procedures Procedures (including critical care time)  Medications Ordered in ED Medications  sodium chloride 0.9 % bolus 1,000 mL (1,000 mLs Intravenous New Bag/Given 10/21/18 1503)  piperacillin-tazobactam (ZOSYN) IVPB 3.375 g (3.375 g Intravenous New Bag/Given 10/21/18 1511)  ondansetron (ZOFRAN) injection 4 mg (4 mg Intravenous Given 10/21/18 1149)  sodium chloride 0.9 % bolus 1,000 mL (0 mLs Intravenous Stopped 10/21/18 1442)  iopamidol (ISOVUE-300) 61 % injection 100 mL (100 mLs Intravenous Contrast Given 10/21/18 1340)     Initial Impression / Assessment and Plan / ED Course  I have reviewed the triage vital signs and the nursing notes.  Pertinent labs & imaging results  that were available during my care of the patient were reviewed by me and considered in my medical decision making (see chart for details).     CT scan shows obstruction of descending colon from inguinal hernia that is nonreducible.  I spoke with general surgery and the patient will be admitted to medicine and started antibiotics with surgery seeing the patient in the morning  Final Clinical Impressions(s) / ED Diagnoses   Final diagnoses:  None    ED Discharge Orders    None       Bethann Berkshire, MD 10/21/18 [phone]

## 2018-10-22 DIAGNOSIS — K4030 Unilateral inguinal hernia, with obstruction, without gangrene, not specified as recurrent: Principal | ICD-10-CM

## 2018-10-22 LAB — CBC
HEMATOCRIT: 37.6 % — AB (ref 39.0–52.0)
HEMOGLOBIN: 12.1 g/dL — AB (ref 13.0–17.0)
MCH: 29.2 pg (ref 26.0–34.0)
MCHC: 32.2 g/dL (ref 30.0–36.0)
MCV: 90.6 fL (ref 80.0–100.0)
NRBC: 0 % (ref 0.0–0.2)
Platelets: 543 10*3/uL — ABNORMAL HIGH (ref 150–400)
RBC: 4.15 MIL/uL — AB (ref 4.22–5.81)
RDW: 12.9 % (ref 11.5–15.5)
WBC: 18.3 10*3/uL — AB (ref 4.0–10.5)

## 2018-10-22 LAB — GLUCOSE, CAPILLARY
Glucose-Capillary: 101 mg/dL — ABNORMAL HIGH (ref 70–99)
Glucose-Capillary: 70 mg/dL (ref 70–99)
Glucose-Capillary: 78 mg/dL (ref 70–99)
Glucose-Capillary: 82 mg/dL (ref 70–99)
Glucose-Capillary: 87 mg/dL (ref 70–99)
Glucose-Capillary: 93 mg/dL (ref 70–99)
Glucose-Capillary: 94 mg/dL (ref 70–99)
Glucose-Capillary: 96 mg/dL (ref 70–99)

## 2018-10-22 LAB — BASIC METABOLIC PANEL
ANION GAP: 8 (ref 5–15)
BUN: 20 mg/dL (ref 8–23)
CHLORIDE: 110 mmol/L (ref 98–111)
CO2: 22 mmol/L (ref 22–32)
Calcium: 8.4 mg/dL — ABNORMAL LOW (ref 8.9–10.3)
Creatinine, Ser: 1.08 mg/dL (ref 0.61–1.24)
GFR calc non Af Amer: >60 mL/min (ref >60)
Glucose, Bld: 94 mg/dL (ref 70–99)
POTASSIUM: 3.2 mmol/L — AB (ref 3.5–5.1)
SODIUM: 140 mmol/L (ref 135–145)

## 2018-10-22 LAB — SURGICAL PCR SCREEN
MRSA, PCR: NEGATIVE
STAPHYLOCOCCUS AUREUS: NEGATIVE

## 2018-10-22 LAB — URINE CULTURE: Culture: NO GROWTH

## 2018-10-22 LAB — TSH: TSH: 1.771 u[IU]/mL (ref 0.350–4.500)

## 2018-10-22 MED ORDER — POTASSIUM CHLORIDE 10 MEQ/100ML IV SOLN
10.0000 meq | INTRAVENOUS | Status: AC
  Administered 2018-10-22 (×3): 10 meq via INTRAVENOUS
  Filled 2018-10-22: qty 100

## 2018-10-22 MED ORDER — DEXTROSE 50 % IV SOLN
INTRAVENOUS | Status: AC
  Filled 2018-10-22: qty 50

## 2018-10-22 MED ORDER — CHLORHEXIDINE GLUCONATE CLOTH 2 % EX PADS
6.0000 | MEDICATED_PAD | Freq: Once | CUTANEOUS | Status: AC
  Administered 2018-10-22: 6 via TOPICAL

## 2018-10-22 MED ORDER — INSULIN ASPART 100 UNIT/ML ~~LOC~~ SOLN: 0.0000 [IU] | Freq: Three times a day (TID) | SUBCUTANEOUS | Status: DC

## 2018-10-22 MED ORDER — POTASSIUM CHLORIDE CRYS ER 20 MEQ PO TBCR
40.0000 meq | EXTENDED_RELEASE_TABLET | Freq: Once | ORAL | Status: AC
  Administered 2018-10-22: 40 meq via ORAL
  Filled 2018-10-22: qty 2

## 2018-10-22 MED ORDER — AMLODIPINE BESYLATE 5 MG PO TABS
5.0000 mg | ORAL_TABLET | Freq: Every day | ORAL | Status: DC
  Administered 2018-10-22 – 2018-10-24 (×3): 5 mg via ORAL
  Filled 2018-10-22 (×3): qty 1

## 2018-10-22 MED ORDER — ENOXAPARIN SODIUM 40 MG/0.4ML ~~LOC~~ SOLN
40.0000 mg | SUBCUTANEOUS | Status: DC
  Filled 2018-10-22: qty 0.4

## 2018-10-22 MED ORDER — NICOTINE 14 MG/24HR TD PT24
14.0000 mg | MEDICATED_PATCH | Freq: Every day | TRANSDERMAL | Status: DC
  Administered 2018-10-22 – 2018-10-23 (×2): 14 mg via TRANSDERMAL
  Filled 2018-10-22 (×3): qty 1

## 2018-10-22 NOTE — Consult Note (Signed)
Reason for Consult: Left inguinal hernia, bowel obstruction Referring Physician: Dr. Harlene Salts Caleb Lynch is an 67 y.o. male.  HPI: Patient is a 67 year old white male who presented the emergency room with persistent nausea and vomiting.  He states he has been intermittently having nausea and vomiting over the past 3 weeks.  He has a known left inguinal hernia that is been present for many years.  He denies pain in the left groin region.  CT scan of the abdomen was performed which revealed a colonic obstruction secondary to the left inguinal hernia.  He was also noted to have urinary infection.  He was admitted to the hospital for further evaluation and treatment.  His left inguinal hernia has reduced overnight.  He currently has no pain.  He did have a bowel movement.  He has been passing flatus.  He has 0 out of 10 left inguinal pain.  Past Medical History:  Diagnosis Date  . Chronic back pain   . Chronic hip pain   . Diabetes mellitus   . HTN (hypertension)   . Hypercholesterolemia     History reviewed. No pertinent surgical history.  Family History  Problem Relation Age of Onset  . Arthritis Unknown   . Diabetes Unknown     Social History:  reports that he has been smoking cigarettes. He has a 40.00 pack-year smoking history. He has never used smokeless tobacco. He reports that he does not drink alcohol or use drugs.  Allergies: No Known Allergies  Medications: I have reviewed the patient's current medications.  Results for orders placed or performed during the hospital encounter of 10/21/18 (from the past 48 hour(s))  Lipase, blood     Status: None   Collection Time: 10/21/18 11:27 AM  Result Value Ref Range   Lipase 21 11 - 51 U/L    Comment: Performed at [facility], [address], [place], [place] [address]  Comprehensive metabolic panel     Status: Abnormal   Collection Time: 10/21/18 11:27 AM  Result Value Ref Range   Sodium 135 135 - 145 mmol/L   Potassium 3.0 (L)  3.5 - 5.1 mmol/L   Chloride 102 98 - 111 mmol/L   CO2 22 22 - 32 mmol/L   Glucose, Bld 131 (H) 70 - 99 mg/dL   BUN 22 8 - 23 mg/dL   Creatinine, Ser 1.07 0.61 - 1.24 mg/dL   Calcium 8.7 (L) 8.9 - 10.3 mg/dL   Total Protein 6.4 (L) 6.5 - 8.1 g/dL   Albumin 3.1 (L) 3.5 - 5.0 g/dL   AST 15 15 - 41 U/L   ALT 16 0 - 44 U/L   Alkaline Phosphatase 69 38 - 126 U/L   Total Bilirubin 0.4 0.3 - 1.2 mg/dL   GFR calc non Af Amer >60 >60 mL/min   GFR calc Af Amer >60 >60 mL/min    Comment: (NOTE) The eGFR has been calculated using the CKD EPI equation. This calculation has not been validated in all clinical situations. eGFR's persistently <60 mL/min signify possible Chronic Kidney Disease.    Anion gap 11 5 - 15    Comment: Performed at [facility], [address] [place], [place] [address]  CBC     Status: Abnormal   Collection Time: 10/21/18 11:27 AM  Result Value Ref Range   WBC 21.8 (H) 4.0 - 10.5 K/uL   RBC 4.55 4.22 - 5.81 MIL/uL   Hemoglobin 13.2 13.0 - 17.0 g/dL   HCT 41.3  39.0 - 52.0 %   MCV 90.8 80.0 - 100.0 fL   MCH 29.0 26.0 - 34.0 pg   MCHC 32.0 30.0 - 36.0 g/dL   RDW 13.1 11.5 - 15.5 %   Platelets 558 (H) 150 - 400 K/uL   nRBC 0.0 0.0 - 0.2 %    Comment: Performed at [facility], [address], [place], [place] [address]  Differential     Status: Abnormal   Collection Time: 10/21/18 11:29 AM  Result Value Ref Range   Neutrophils Relative % 89 %   Neutro Abs 19.0 (H) 1.7 - 7.7 K/uL   Lymphocytes Relative 6 %   Lymphs Abs 1.2 0.7 - 4.0 K/uL   Monocytes Relative 4 %   Monocytes Absolute 0.9 0.1 - 1.0 K/uL   Eosinophils Relative 0 %   Eosinophils Absolute 0.0 0.0 - 0.5 K/uL   Basophils Relative 0 %   Basophils Absolute 0.0 0.0 - 0.1 K/uL   Immature Granulocytes 1 %   Abs Immature Granulocytes 0.14 (H) 0.00 - 0.07 K/uL    Comment: Performed at [facility], [address], [place], [place] [address]  Urinalysis, Routine w reflex microscopic     Status: Abnormal    Collection Time: 10/21/18  3:03 PM  Result Value Ref Range   Color, Urine YELLOW YELLOW   APPearance CLEAR CLEAR   Specific Gravity, Urine 1.030 1.005 - 1.030   pH 6.0 5.0 - 8.0   Glucose, UA NEGATIVE NEGATIVE mg/dL   Hgb urine dipstick NEGATIVE NEGATIVE   Bilirubin Urine NEGATIVE NEGATIVE   Ketones, ur NEGATIVE NEGATIVE mg/dL   Protein, ur NEGATIVE NEGATIVE mg/dL   Nitrite NEGATIVE NEGATIVE   Leukocytes, UA TRACE (A) NEGATIVE   RBC / HPF 0-5 0 - 5 RBC/hpf   WBC, UA 11-20 0 - 5 WBC/hpf   Bacteria, UA NONE SEEN NONE SEEN   Mucus PRESENT     Comment: Performed at [facility], [address], [place], [place] [address]  Glucose, capillary     Status: Abnormal   Collection Time: 10/21/18  4:40 PM  Result Value Ref Range   Glucose-Capillary 106 (H) 70 - 99 mg/dL   Comment 1 Notify RN    Comment 2 Document in Chart   Glucose, capillary     Status: None   Collection Time: 10/21/18 10:15 PM  Result Value Ref Range   Glucose-Capillary 93 70 - 99 mg/dL   Comment 1 Notify RN    Comment 2 Document in Chart   Glucose, capillary     Status: None   Collection Time: 10/22/18 12:27 AM  Result Value Ref Range   Glucose-Capillary 94 70 - 99 mg/dL   Comment 1 Notify RN    Comment 2 Document in Chart   Glucose, capillary     Status: Abnormal   Collection Time: 10/22/18  4:40 AM  Result Value Ref Range   Glucose-Capillary 101 (H) 70 - 99 mg/dL   Comment 1 Notify RN    Comment 2 Document in Chart   Basic metabolic panel     Status: Abnormal   Collection Time: 10/22/18  6:16 AM  Result Value Ref Range   Sodium 140 135 - 145 mmol/L   Potassium 3.2 (L) 3.5 - 5.1 mmol/L   Chloride 110 98 - 111 mmol/L   CO2 22 22 - 32 mmol/L   Glucose, Bld 94 70 - 99 mg/dL   BUN 20 8 - 23 mg/dL   Creatinine, Ser  1.08 0.61 - 1.24 mg/dL   Calcium 8.4 (L) 8.9 - 10.3 mg/dL   GFR calc non Af Amer >60 >60 mL/min   GFR calc Af Amer >60 >60 mL/min    Comment: (NOTE) The eGFR has been calculated using the CKD EPI  equation. This calculation has not been validated in all clinical situations. eGFR's persistently <60 mL/min signify possible Chronic Kidney Disease.    Anion gap 8 5 - 15    Comment: Performed at [facility], [address] [place], [place] [address]  CBC     Status: Abnormal   Collection Time: 10/22/18  6:16 AM  Result Value Ref Range   WBC 18.3 (H) 4.0 - 10.5 K/uL   RBC 4.15 (L) 4.22 - 5.81 MIL/uL   Hemoglobin 12.1 (L) 13.0 - 17.0 g/dL   HCT 37.6 (L) 39.0 - 52.0 %   MCV 90.6 80.0 - 100.0 fL   MCH 29.2 26.0 - 34.0 pg   MCHC 32.2 30.0 - 36.0 g/dL   RDW 12.9 11.5 - 15.5 %   Platelets 543 (H) 150 - 400 K/uL   nRBC 0.0 0.0 - 0.2 %    Comment: Performed at [facility], [address], [place], [place] [address]  TSH     Status: None   Collection Time: 10/22/18  6:16 AM  Result Value Ref Range   TSH 1.771 0.350 - 4.500 uIU/mL    Comment: Performed by a 3rd Generation assay with a functional sensitivity of <=0.01 uIU/mL. Performed at [facility], [address] [place], [place] [address]   Glucose, capillary     Status: None   Collection Time: 10/22/18  7:27 AM  Result Value Ref Range   Glucose-Capillary 70 70 - 99 mg/dL  Glucose, capillary     Status: None   Collection Time: 10/22/18  8:31 AM  Result Value Ref Range   Glucose-Capillary 93 70 - 99 mg/dL    Ct Abdomen Pelvis W Contrast  Result Date: 10/21/2018 CLINICAL DATA:  Nausea and vomiting for 1 week.  Weakness fatigue. EXAM: CT ABDOMEN AND PELVIS WITH CONTRAST TECHNIQUE: Multidetector CT imaging of the abdomen and pelvis was performed using the standard protocol following bolus administration of intravenous contrast. CONTRAST:  118m ISOVUE-300 IOPAMIDOL (ISOVUE-300) INJECTION 61% COMPARISON:  None. FINDINGS: Lower chest: Lung bases are clear. Hepatobiliary: No focal hepatic lesion. No biliary duct dilatation. Gallbladder is normal. Common bile duct is normal. Pancreas: Pancreas is normal. No ductal dilatation. No  pancreatic inflammation. Spleen: Normal spleen Adrenals/urinary tract: Nodular thickening of the LEFT adrenal gland. The lateral limb of the LEFT adrenal gland measures 10 mm. The medial limb measures 12 mm. RIGHT adrenal gland normal. There is ill-defined rounded region of hypoperfusion in the upper pole of the LEFT kidney measuring 2.4 cm (image 25/2). Second lesion in the posterior more superior aspect of the upper pole LEFT kidney is similar measuring 2.2 cm (image 22/2). Benign cyst in the LEFT renal hilum. There is a region of hypoperfusion in the upper pole of the RIGHT kidney which less well-defined (image 27/2. Potential region of hypoperfusion in the lower pole of the RIGHT kidney Stomach/Bowel: Stomach, duodenum and small bowel are normal. The cecum is fluid-filled. Appendix is not identified. There is pericolonic inflammation along the ascending colon. The transverse colon is fluid-filled. The descending colon is distended and fluid-filled. The descending colon enters LEFT inguinal hernia. Proximal to the descending colon entering the hernia the diameter measures 5.3 cm. Upon exiting the  LEFT inguinal hernia, the bowel diameter is 1.6 cm and collapsed. The more distal sigmoid colon and rectum are near complete collapse. Findings are consistent with high-grade mechanical obstruction of the descending colon upon entering the LEFT inguinal hernia. Vascular/Lymphatic: Abdominal aorta is normal caliber with atherosclerotic calcification. There is no retroperitoneal or periportal lymphadenopathy. No pelvic lymphadenopathy. Reproductive: Prostate normal Other: Small free fluid the pelvis. Musculoskeletal: No aggressive osseous lesion. IMPRESSION: 1. High-grade mechanical obstruction of the descending secondary to entering and exiting a LEFT inguinal hernia. Obstruction occurs within the hernia sac. 2. Fluid-filled colon from the level of LEFT inguinal obstruction back through to the cecum. There is inflammation  along the ascending colon. No perforation or pneumatosis. 3. Free fluid in the pelvis related to obstruction. 4. There bilateral regions of hypoperfusion in LEFT and RIGHT kidney. The regions in the LEFT kidney are more well-defined. Favor multifocal bilateral pyelonephritis over LEFT renal neoplasm. Recommend correlation with urinalysis and leukocytosis. 5. Nodular thickening of the LEFT adrenal gland is indeterminate. Recommend adrenal protocol MRI at some point to evaluate these LEFT adrenal lesions. Findings conveyed toJOSEPH ZAMMIT on 10/21/2018  at14:50. Electronically Signed   By: Suzy Bouchard M.D.   On: 10/21/2018 14:50   Dg Abd Acute W/chest  Result Date: 10/21/2018 CLINICAL DATA:  Vomiting for the past 3-4 weeks. EXAM: DG ABDOMEN ACUTE W/ 1V CHEST COMPARISON:  None. FINDINGS: The heart size and mediastinal contours are within normal limits. Normal pulmonary vascularity. Chronic bronchitic changes, likely smoking-related. Small focal opacity at the peripheral right lung base with adjacent linear scarring. Mild dilatation of the transverse, descending, and sigmoid colon. Air-fluid levels seen in the transverse and descending colon. No small bowel dilatation. There is no evidence of free intraperitoneal air. No radiopaque calculi or other significant radiographic abnormality is seen. No acute osseous abnormality. IMPRESSION: 1. Mildly dilated transverse and left colon with air-fluid levels, concerning for large bowel obstruction. Recommend CT of the abdomen pelvis with contrast for further evaluation. 2. Small focal opacity in the peripheral right lung base is favored to reflect scarring/atelectasis. Electronically Signed   By: Titus Dubin M.D.   On: 10/21/2018 11:49    ROS:  Pertinent items are noted in HPI.  Blood pressure 132/75, pulse 68, temperature 97.7 F (36.5 C), temperature source Oral, resp. rate 16, height _0  (1.778 m), weight 70.8 kg, SpO2 96 %. Physical Exam: Pleasant  well-developed well-nourished white male no acute distress Head is normocephalic, atraumatic Lungs clear to auscultation with equal breath sounds bilaterally Heart examination reveals regular rate and rhythm without S3, S4, murmurs Abdomen is soft, nontender, nondistended.  Easily reducible left inguinal hernia is noted.  A smaller right inguinal hernia is present. Genitourinary examination is within normal limits.  CT scan of the abdomen and pelvis images personally reviewed  Assessment/Plan: Impression: Left inguinal hernia causing colonic obstruction.  The obstruction seems to have resolved.  His left inguinal hernia as reduced on its own. Pyelonephritis  Plan: Patient be taken to the operating room tomorrow for left inguinal herniorrhaphy with mesh.  The risks and benefits of the procedure including bleeding, infection, mesh use, and the possibility of recurrence of the hernia were fully explained to the patient, who gave informed consent.  Caleb Lynch 10/22/2018, 9:52 AM

## 2018-10-22 NOTE — Progress Notes (Addendum)
Patient Demographics:    Caleb Lynch, is a 67 y.o. male, DOB - [birth year], VWU:[id number]  Admit date - 10/21/2018   Admitting Physician Erick Blinks, MD  Outpatient Primary MD for the patient is Oval Linsey, MD  LOS - 1   Chief Complaint  Patient presents with  . Emesis        Subjective:    Caleb Lynch today has no fevers, no emesis,  No chest pain,  No sob, family members at bedside, questions answered, left lower quadrant abdominal pain and inguinal area discomfort is mostly resolved  Assessment  & Plan :    Principal Problem:   Inguinal hernia of left side with obstruction Active Problems:   HTN (hypertension)   DM type 2 (diabetes mellitus, type 2) (HCC)   Tobacco abuse   Hypokalemia   Brief Summary:- 66 y.o. male with medical history significant of hypertension, diabetes, left inguinal hernia admitted on 10/2018 with intractable emesis and abdominal pain secondary to colonic obstruction due to incarcerated left inguinal hernia, to OR 10/23/18 for Hernia Repair  Plan:- 1)Colonic obstruction secondary to the left inguinal hernia--incarcerated left inguinal hernia appears to have self reduced,... Surgical consult from Dr. Lovell Sheehan appreciated plan is for left inguinal hernia repair with mesh on 10/23/2018, IV Zosyn per general surgeon for possible bacterial translocation, white count is down to 18.3 from 21.8 no fevers no chills  2) possible UTI with concerns about possible pyelonephritis--currently on IV Zosyn pending culture results  3)DM2-hold metformin, use Novolog/Humalog Sliding scale insulin with Accu-Cheks/Fingersticks as ordered   4)FEN--- continue IV fluids, patient will be n.p.o. overnight for hernia repair in a.m.  5)HTN--- stable, restart amlodipine, hold ACEI,  may use IV Hydralazine 10 mg  Every 4 hours Prn for systolic blood pressure over 170 mmhg  6)Tobacco  Abuse--- Smoking cessation counseling for 4 minutes today, consider nicotine patch I have discussed tobacco cessation with the patient.  I have counseled the patient regarding the negative impacts of continued tobacco use including but not limited to lung cancer, COPD, and cardiovascular disease.  I have discussed alternatives to tobacco and modalities that may help facilitate tobacco cessation including but not limited to biofeedback, hypnosis, and medications.  Total time spent with tobacco counseling was 4 minutes.  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 amLODipine  5 mg Oral Daily  . Chlorhexidine Gluconate Cloth  6 each Topical Once  . enoxaparin (LOVENOX) injection  40 mg Subcutaneous Q24H  . insulin aspart  0-9 Units Subcutaneous TID WC  . nicotine  14 mg Transdermal Daily   Continuous Infusions: . 0.9 % NaCl with KCl 40 mEq / L 75 mL/hr (10/22/18 1146)  . piperacillin-tazobactam (ZOSYN)  IV 3.375 g (10/22/18 0646)  . potassium chloride 10 mEq (10/22/18 1126)   PRN Meds:.acetaminophen **OR** acetaminophen, hydrALAZINE, ketorolac, ondansetron **OR** ondansetron (ZOFRAN) IV   Anti-infectives (From admission, onward)   Start     Dose/Rate Route Frequency Ordered Stop   10/21/18 2300  piperacillin-tazobactam (ZOSYN) IVPB 3.375 g     3.375 g 12.5 mL/hr over 240 Minutes Intravenous Every 8 hours 10/21/18 1845     10/21/18 1515  piperacillin-tazobactam (ZOSYN) IVPB 3.375 g     3.375 g 100 mL/hr over 30  Minutes Intravenous  Once 10/21/18 1501 10/21/18 1545        Objective:   Vitals:   10/21/18 1603 10/21/18 2006 10/21/18 2213 10/22/18 0443  BP: (!) 148/91  139/77 132/75  Pulse: 78  75 68  Resp: 16   16  Temp: 98.2 F (36.8 C)  98.3 F (36.8 C) 97.7 F (36.5 C)  TempSrc: Oral  Oral Oral  SpO2: 95% 92% 95% 96%  Weight: 70.8 kg     Height: 5\' 10"  (1.778 m)       Wt Readings from Last 3 Encounters:  10/21/18 70.8 kg  07/20/16 83 kg  07/03/13 83.5 kg     Intake/Output Summary (Last 24 hours) at 10/22/2018 1204 Last data filed at 10/22/2018 0800 Gross per 24 hour  Intake 2820.4 ml  Output 1000 ml  Net 1820.4 ml    Physical Exam Patient is examined daily including today on 10/22/18 , exams remain the same as of yesterday except that has changed   Gen:- Awake Alert,  In no apparent distress  HEENT:- [place].AT, No sclera icterus Neck-Supple Neck,No JVD,.  Lungs-  CTAB , good air movement CV- S1, S2 normal, regular Abd-  +ve B.Sounds, ND, Abd Soft, left inguinal hernia appears to   have reduce, left lower quadrant area discomfort on palpation but no significant tenderness per se Extremity/Skin:- No  edema,   good pulses Psych-affect is appropriate, oriented x3 Neuro-no new focal deficits, no tremors   Data Review:   Micro Results No results found for this or any previous visit (from the past 240 hour(s)).  Radiology Reports Ct Abdomen Pelvis W Contrast  Result Date: 10/21/2018 CLINICAL DATA:  Nausea and vomiting for 1 week.  Weakness fatigue. EXAM: CT ABDOMEN AND PELVIS WITH CONTRAST TECHNIQUE: Multidetector CT imaging of the abdomen and pelvis was performed using the standard protocol following bolus administration of intravenous contrast. CONTRAST:  ISOVUE-300 IOPAMIDOL (ISOVUE-300) INJECTION 61% COMPARISON:  None. FINDINGS: Lower chest: Lung bases are clear. Hepatobiliary: No focal hepatic lesion. No biliary duct dilatation. Gallbladder is normal. Common bile duct  is normal. Pancreas: Pancreas is normal. No ductal dilatation. No pancreatic inflammation. Spleen: Normal spleen Adrenals/urinary tract: Nodular thickening of the LEFT adrenal gland. The lateral limb of the LEFT adrenal gland measures 10 mm. The medial limb measures 12 mm. RIGHT adrenal gland normal. There is ill-defined rounded region of hypoperfusion in the upper pole of the LEFT kidney measuring 2.4 cm (image 25/2). Second lesion in the posterior more superior aspect of the upper pole LEFT kidney is similar measuring 2.2 cm (image 22/2). Benign cyst in the LEFT renal hilum. There is a region of hypoperfusion in the upper pole of the RIGHT kidney which less well-defined (image 27/2. Potential region of hypoperfusion in the lower pole of the RIGHT kidney Stomach/Bowel: Stomach, duodenum and small bowel are normal. The cecum is fluid-filled. Appendix is not identified. There is pericolonic inflammation along the ascending  colon. The transverse colon is fluid-filled. The descending colon is distended and fluid-filled. The descending colon enters LEFT inguinal hernia. Proximal to the descending colon entering the hernia the diameter measures 5.3 cm. Upon exiting the LEFT inguinal hernia, the bowel diameter is 1.6 cm and collapsed. The more distal sigmoid colon and rectum are near complete collapse. Findings are consistent with high-grade mechanical obstruction of the descending colon upon entering the LEFT inguinal hernia. Vascular/Lymphatic: Abdominal aorta is normal caliber with atherosclerotic calcification. There is no retroperitoneal or periportal lymphadenopathy. No pelvic lymphadenopathy. Reproductive: Prostate normal Other: Small free fluid the pelvis. Musculoskeletal: No aggressive osseous lesion. IMPRESSION: 1. High-grade mechanical obstruction of the descending secondary to entering and exiting a LEFT inguinal hernia. Obstruction occurs within the hernia sac. 2. Fluid-filled colon from the level of LEFT  inguinal obstruction back through to the cecum. There is inflammation along the ascending colon. No perforation or pneumatosis. 3. Free fluid in the pelvis related to obstruction. 4. There bilateral regions of hypoperfusion in LEFT and RIGHT kidney. The regions in the LEFT kidney are more well-defined. Favor multifocal bilateral pyelonephritis over LEFT renal neoplasm. Recommend correlation with urinalysis and leukocytosis. 5. Nodular thickening of the LEFT adrenal gland is indeterminate. Recommend adrenal protocol MRI at some point to evaluate these LEFT adrenal lesions. Findings conveyed toJOSEPH ZAMMIT on 10/21/2018  at14:50. Electronically Signed   By: Genevive Bi M.D.   On: 10/21/2018 14:50   Dg Abd Acute W/chest  Result Date: 10/21/2018 CLINICAL DATA:  Vomiting for the past 3-4 weeks. EXAM: DG ABDOMEN ACUTE W/ 1V CHEST COMPARISON:  None. FINDINGS: The heart size and mediastinal contours are within normal limits. Normal pulmonary vascularity. Chronic bronchitic changes, likely smoking-related. Small focal opacity at the peripheral right lung base with adjacent linear scarring. Mild dilatation of the transverse, descending, and sigmoid colon. Air-fluid levels seen in the transverse and descending colon. No small bowel dilatation. There is no evidence of free intraperitoneal air. No radiopaque calculi or other significant radiographic abnormality is seen. No acute osseous abnormality. IMPRESSION: 1. Mildly dilated transverse and left colon with air-fluid levels, concerning for large bowel obstruction. Recommend CT of the abdomen pelvis with contrast for further evaluation. 2. Small focal opacity in the peripheral right lung base is favored to reflect scarring/atelectasis. Electronically Signed   By: Obie Dredge M.D.   On: 10/21/2018 11:49     CBC Recent Labs  Lab 10/21/18 1127 10/21/18 1129 10/22/18 0616  WBC 21.8*  --  18.3*  HGB 13.2  --  12.1*  HCT 41.3  --  37.6*  PLT 558*  --  543*    MCV 90.8  --  90.6  MCH 29.0  --  29.2  MCHC 32.0  --  32.2  RDW 13.1  --  12.9  LYMPHSABS  --  1.2  --   MONOABS  --  0.9  --   EOSABS  --  0.0  --   BASOSABS  --  0.0  --     Chemistries  Recent Labs  Lab 10/21/18 1127 10/22/18 0616  NA 135 140  K 3.0* 3.2*  CL 102 110  CO2 22 22  GLUCOSE 131* 94  BUN 22 20  CREATININE 1.07 1.08  CALCIUM 8.7* 8.4*  AST 15  --   ALT 16  --   ALKPHOS 69  --   BILITOT 0.4  --    ------------------------------------------------------------------------------------------------------------------ No results for input(s): CHOL, HDL, LDLCALC, TRIG, CHOLHDL, LDLDIRECT in the  last 72 hours.  No results found for: HGBA1C ------------------------------------------------------------------------------------------------------------------ Recent Labs    10/22/18 0616  TSH 1.771   ------------------------------------------------------------------------------------------------------------------ No results for input(s): VITAMINB12, FOLATE, FERRITIN, TIBC, IRON, RETICCTPCT in the last 72 hours.  Coagulation profile No results for input(s): INR, PROTIME in the last 168 hours.  No results for input(s): DDIMER in the last 72 hours.  Cardiac Enzymes No results for input(s): CKMB, TROPONINI, MYOGLOBIN in the last 168 hours.  Invalid input(s): CK ------------------------------------------------------------------------------------------------------------------ No results found for: BNP   Shon Hale M.D on 10/22/2018 at 12:04 PM  Pager---[phone] Go to [email or web address] - password [email or web address] for contact info  [facility] - Office  [phone]

## 2018-10-22 NOTE — H&P (View-Only) (Signed)
Reason for Consult: Left inguinal hernia, bowel obstruction Referring Physician: Dr. Harlene Salts Goldberg is an 67 y.o. male.  HPI: Patient is a 67 year old white male who presented the emergency room with persistent nausea and vomiting.  He states he has been intermittently having nausea and vomiting over the past 3 weeks.  He has a known left inguinal hernia that is been present for many years.  He denies pain in the left groin region.  CT scan of the abdomen was performed which revealed a colonic obstruction secondary to the left inguinal hernia.  He was also noted to have urinary infection.  He was admitted to the hospital for further evaluation and treatment.  His left inguinal hernia has reduced overnight.  He currently has no pain.  He did have a bowel movement.  He has been passing flatus.  He has 0 out of 10 left inguinal pain.  Past Medical History:  Diagnosis Date  . Chronic back pain   . Chronic hip pain   . Diabetes mellitus   . HTN (hypertension)   . Hypercholesterolemia     History reviewed. No pertinent surgical history.  Family History  Problem Relation Age of Onset  . Arthritis Unknown   . Diabetes Unknown     Social History:  reports that he has been smoking cigarettes. He has a 40.00 pack-year smoking history. He has never used smokeless tobacco. He reports that he does not drink alcohol or use drugs.  Allergies: No Known Allergies  Medications: I have reviewed the patient's current medications.  Results for orders placed or performed during the hospital encounter of 10/21/18 (from the past 48 hour(s))  Lipase, blood     Status: None   Collection Time: 10/21/18 11:27 AM  Result Value Ref Range   Lipase 21 11 - 51 U/L    Comment: Performed at [facility], [address], [place], [place] [address]  Comprehensive metabolic panel     Status: Abnormal   Collection Time: 10/21/18 11:27 AM  Result Value Ref Range   Sodium 135 135 - 145 mmol/L   Potassium 3.0 (L)  3.5 - 5.1 mmol/L   Chloride 102 98 - 111 mmol/L   CO2 22 22 - 32 mmol/L   Glucose, Bld 131 (H) 70 - 99 mg/dL   BUN 22 8 - 23 mg/dL   Creatinine, Ser 1.07 0.61 - 1.24 mg/dL   Calcium 8.7 (L) 8.9 - 10.3 mg/dL   Total Protein 6.4 (L) 6.5 - 8.1 g/dL   Albumin 3.1 (L) 3.5 - 5.0 g/dL   AST 15 15 - 41 U/L   ALT 16 0 - 44 U/L   Alkaline Phosphatase 69 38 - 126 U/L   Total Bilirubin 0.4 0.3 - 1.2 mg/dL   GFR calc non Af Amer >60 >60 mL/min   GFR calc Af Amer >60 >60 mL/min    Comment: (NOTE) The eGFR has been calculated using the CKD EPI equation. This calculation has not been validated in all clinical situations. eGFR's persistently <60 mL/min signify possible Chronic Kidney Disease.    Anion gap 11 5 - 15    Comment: Performed at [facility], [address] [place], [place] [address]  CBC     Status: Abnormal   Collection Time: 10/21/18 11:27 AM  Result Value Ref Range   WBC 21.8 (H) 4.0 - 10.5 K/uL   RBC 4.55 4.22 - 5.81 MIL/uL   Hemoglobin 13.2 13.0 - 17.0 g/dL   HCT 41.3  39.0 - 52.0 %   MCV 90.8 80.0 - 100.0 fL   MCH 29.0 26.0 - 34.0 pg   MCHC 32.0 30.0 - 36.0 g/dL   RDW 13.1 11.5 - 15.5 %   Platelets 558 (H) 150 - 400 K/uL   nRBC 0.0 0.0 - 0.2 %    Comment: Performed at [facility], [address] [place], [place] [address]  Differential     Status: Abnormal   Collection Time: 10/21/18 11:29 AM  Result Value Ref Range   Neutrophils Relative % 89 %   Neutro Abs 19.0 (H) 1.7 - 7.7 K/uL   Lymphocytes Relative 6 %   Lymphs Abs 1.2 0.7 - 4.0 K/uL   Monocytes Relative 4 %   Monocytes Absolute 0.9 0.1 - 1.0 K/uL   Eosinophils Relative 0 %   Eosinophils Absolute 0.0 0.0 - 0.5 K/uL   Basophils Relative 0 %   Basophils Absolute 0.0 0.0 - 0.1 K/uL   Immature Granulocytes 1 %   Abs Immature Granulocytes 0.14 (H) 0.00 - 0.07 K/uL    Comment: Performed at [facility], [address] [place], [place] [address]  Urinalysis, Routine w reflex microscopic     Status: Abnormal    Collection Time: 10/21/18  3:03 PM  Result Value Ref Range   Color, Urine YELLOW YELLOW   APPearance CLEAR CLEAR   Specific Gravity, Urine 1.030 1.005 - 1.030   pH 6.0 5.0 - 8.0   Glucose, UA NEGATIVE NEGATIVE mg/dL   Hgb urine dipstick NEGATIVE NEGATIVE   Bilirubin Urine NEGATIVE NEGATIVE   Ketones, ur NEGATIVE NEGATIVE mg/dL   Protein, ur NEGATIVE NEGATIVE mg/dL   Nitrite NEGATIVE NEGATIVE   Leukocytes, UA TRACE (A) NEGATIVE   RBC / HPF 0-5 0 - 5 RBC/hpf   WBC, UA 11-20 0 - 5 WBC/hpf   Bacteria, UA NONE SEEN NONE SEEN   Mucus PRESENT     Comment: Performed at [facility], [address] [place], [place] [address]  Glucose, capillary     Status: Abnormal   Collection Time: 10/21/18  4:40 PM  Result Value Ref Range   Glucose-Capillary 106 (H) 70 - 99 mg/dL   Comment 1 Notify RN    Comment 2 Document in Chart   Glucose, capillary     Status: None   Collection Time: 10/21/18 10:15 PM  Result Value Ref Range   Glucose-Capillary 93 70 - 99 mg/dL   Comment 1 Notify RN    Comment 2 Document in Chart   Glucose, capillary     Status: None   Collection Time: 10/22/18 12:27 AM  Result Value Ref Range   Glucose-Capillary 94 70 - 99 mg/dL   Comment 1 Notify RN    Comment 2 Document in Chart   Glucose, capillary     Status: Abnormal   Collection Time: 10/22/18  4:40 AM  Result Value Ref Range   Glucose-Capillary 101 (H) 70 - 99 mg/dL   Comment 1 Notify RN    Comment 2 Document in Chart   Basic metabolic panel     Status: Abnormal   Collection Time: 10/22/18  6:16 AM  Result Value Ref Range   Sodium 140 135 - 145 mmol/L   Potassium 3.2 (L) 3.5 - 5.1 mmol/L   Chloride 110 98 - 111 mmol/L   CO2 22 22 - 32 mmol/L   Glucose, Bld 94 70 - 99 mg/dL   BUN 20 8 - 23 mg/dL   Creatinine, Ser   1.08 0.61 - 1.24 mg/dL   Calcium 8.4 (L) 8.9 - 10.3 mg/dL   GFR calc non Af Amer >60 >60 mL/min   GFR calc Af Amer >60 >60 mL/min    Comment: (NOTE) The eGFR has been calculated using the CKD EPI  equation. This calculation has not been validated in all clinical situations. eGFR's persistently <60 mL/min signify possible Chronic Kidney Disease.    Anion gap 8 5 - 15    Comment: Performed at [facility], [address] [place], [place] [address]  CBC     Status: Abnormal   Collection Time: 10/22/18  6:16 AM  Result Value Ref Range   WBC 18.3 (H) 4.0 - 10.5 K/uL   RBC 4.15 (L) 4.22 - 5.81 MIL/uL   Hemoglobin 12.1 (L) 13.0 - 17.0 g/dL   HCT 37.6 (L) 39.0 - 52.0 %   MCV 90.6 80.0 - 100.0 fL   MCH 29.2 26.0 - 34.0 pg   MCHC 32.2 30.0 - 36.0 g/dL   RDW 12.9 11.5 - 15.5 %   Platelets 543 (H) 150 - 400 K/uL   nRBC 0.0 0.0 - 0.2 %    Comment: Performed at [facility], [address], [place], [place] [address]  TSH     Status: None   Collection Time: 10/22/18  6:16 AM  Result Value Ref Range   TSH 1.771 0.350 - 4.500 uIU/mL    Comment: Performed by a 3rd Generation assay with a functional sensitivity of <=0.01 uIU/mL. Performed at [facility], [address] [place], [place] [address]   Glucose, capillary     Status: None   Collection Time: 10/22/18  7:27 AM  Result Value Ref Range   Glucose-Capillary 70 70 - 99 mg/dL  Glucose, capillary     Status: None   Collection Time: 10/22/18  8:31 AM  Result Value Ref Range   Glucose-Capillary 93 70 - 99 mg/dL    Ct Abdomen Pelvis W Contrast  Result Date: 10/21/2018 CLINICAL DATA:  Nausea and vomiting for 1 week.  Weakness fatigue. EXAM: CT ABDOMEN AND PELVIS WITH CONTRAST TECHNIQUE: Multidetector CT imaging of the abdomen and pelvis was performed using the standard protocol following bolus administration of intravenous contrast. CONTRAST:  118m ISOVUE-300 IOPAMIDOL (ISOVUE-300) INJECTION 61% COMPARISON:  None. FINDINGS: Lower chest: Lung bases are clear. Hepatobiliary: No focal hepatic lesion. No biliary duct dilatation. Gallbladder is normal. Common bile duct is normal. Pancreas: Pancreas is normal. No ductal dilatation. No  pancreatic inflammation. Spleen: Normal spleen Adrenals/urinary tract: Nodular thickening of the LEFT adrenal gland. The lateral limb of the LEFT adrenal gland measures 10 mm. The medial limb measures 12 mm. RIGHT adrenal gland normal. There is ill-defined rounded region of hypoperfusion in the upper pole of the LEFT kidney measuring 2.4 cm (image 25/2). Second lesion in the posterior more superior aspect of the upper pole LEFT kidney is similar measuring 2.2 cm (image 22/2). Benign cyst in the LEFT renal hilum. There is a region of hypoperfusion in the upper pole of the RIGHT kidney which less well-defined (image 27/2. Potential region of hypoperfusion in the lower pole of the RIGHT kidney Stomach/Bowel: Stomach, duodenum and small bowel are normal. The cecum is fluid-filled. Appendix is not identified. There is pericolonic inflammation along the ascending colon. The transverse colon is fluid-filled. The descending colon is distended and fluid-filled. The descending colon enters LEFT inguinal hernia. Proximal to the descending colon entering the hernia the diameter measures 5.3 cm. Upon exiting the  LEFT inguinal hernia, the bowel diameter is 1.6 cm and collapsed. The more distal sigmoid colon and rectum are near complete collapse. Findings are consistent with high-grade mechanical obstruction of the descending colon upon entering the LEFT inguinal hernia. Vascular/Lymphatic: Abdominal aorta is normal caliber with atherosclerotic calcification. There is no retroperitoneal or periportal lymphadenopathy. No pelvic lymphadenopathy. Reproductive: Prostate normal Other: Small free fluid the pelvis. Musculoskeletal: No aggressive osseous lesion. IMPRESSION: 1. High-grade mechanical obstruction of the descending secondary to entering and exiting a LEFT inguinal hernia. Obstruction occurs within the hernia sac. 2. Fluid-filled colon from the level of LEFT inguinal obstruction back through to the cecum. There is inflammation  along the ascending colon. No perforation or pneumatosis. 3. Free fluid in the pelvis related to obstruction. 4. There bilateral regions of hypoperfusion in LEFT and RIGHT kidney. The regions in the LEFT kidney are more well-defined. Favor multifocal bilateral pyelonephritis over LEFT renal neoplasm. Recommend correlation with urinalysis and leukocytosis. 5. Nodular thickening of the LEFT adrenal gland is indeterminate. Recommend adrenal protocol MRI at some point to evaluate these LEFT adrenal lesions. Findings conveyed toJOSEPH ZAMMIT on 10/21/2018  at14:50. Electronically Signed   By: Suzy Bouchard M.D.   On: 10/21/2018 14:50   Dg Abd Acute W/chest  Result Date: 10/21/2018 CLINICAL DATA:  Vomiting for the past 3-4 weeks. EXAM: DG ABDOMEN ACUTE W/ 1V CHEST COMPARISON:  None. FINDINGS: The heart size and mediastinal contours are within normal limits. Normal pulmonary vascularity. Chronic bronchitic changes, likely smoking-related. Small focal opacity at the peripheral right lung base with adjacent linear scarring. Mild dilatation of the transverse, descending, and sigmoid colon. Air-fluid levels seen in the transverse and descending colon. No small bowel dilatation. There is no evidence of free intraperitoneal air. No radiopaque calculi or other significant radiographic abnormality is seen. No acute osseous abnormality. IMPRESSION: 1. Mildly dilated transverse and left colon with air-fluid levels, concerning for large bowel obstruction. Recommend CT of the abdomen pelvis with contrast for further evaluation. 2. Small focal opacity in the peripheral right lung base is favored to reflect scarring/atelectasis. Electronically Signed   By: Titus Dubin M.D.   On: 10/21/2018 11:49    ROS:  Pertinent items are noted in HPI.  Blood pressure 132/75, pulse 68, temperature 97.7 F (36.5 C), temperature source Oral, resp. rate 16, height _0  (1.778 m), weight 70.8 kg, SpO2 96 %. Physical Exam: Pleasant  well-developed well-nourished white male no acute distress Head is normocephalic, atraumatic Lungs clear to auscultation with equal breath sounds bilaterally Heart examination reveals regular rate and rhythm without S3, S4, murmurs Abdomen is soft, nontender, nondistended.  Easily reducible left inguinal hernia is noted.  A smaller right inguinal hernia is present. Genitourinary examination is within normal limits.  CT scan of the abdomen and pelvis images personally reviewed  Assessment/Plan: Impression: Left inguinal hernia causing colonic obstruction.  The obstruction seems to have resolved.  His left inguinal hernia as reduced on its own. Pyelonephritis  Plan: Patient be taken to the operating room tomorrow for left inguinal herniorrhaphy with mesh.  The risks and benefits of the procedure including bleeding, infection, mesh use, and the possibility of recurrence of the hernia were fully explained to the patient, who gave informed consent.  Aviva Signs 10/22/2018, 9:52 AM

## 2018-10-23 ENCOUNTER — Inpatient Hospital Stay (HOSPITAL_COMMUNITY): Payer: Medicare Other | Admitting: Anesthesiology

## 2018-10-23 ENCOUNTER — Encounter (HOSPITAL_COMMUNITY): Payer: Self-pay | Admitting: *Deleted

## 2018-10-23 ENCOUNTER — Encounter (HOSPITAL_COMMUNITY)
Admission: EM | Disposition: A | Discharge status: Home / Self Care | Payer: Self-pay | Source: Home / Self Care | Attending: Nephrology

## 2018-10-23 DIAGNOSIS — I10 Essential (primary) hypertension: Secondary | ICD-10-CM

## 2018-10-23 DIAGNOSIS — E278 Other specified disorders of adrenal gland: Secondary | ICD-10-CM

## 2018-10-23 DIAGNOSIS — N2889 Other specified disorders of kidney and ureter: Secondary | ICD-10-CM

## 2018-10-23 DIAGNOSIS — E119 Type 2 diabetes mellitus without complications: Secondary | ICD-10-CM

## 2018-10-23 HISTORY — PX: INGUINAL HERNIA REPAIR: SHX194

## 2018-10-23 LAB — URINALYSIS, ROUTINE W REFLEX MICROSCOPIC
Bilirubin Urine: NEGATIVE
Glucose, UA: NEGATIVE mg/dL
Leukocytes, UA: NEGATIVE
NITRITE: NEGATIVE
PH: 5.5 (ref 5.0–8.0)
Specific Gravity, Urine: 1.02 (ref 1.005–1.030)

## 2018-10-23 LAB — BASIC METABOLIC PANEL
ANION GAP: 6 (ref 5–15)
BUN: 13 mg/dL (ref 8–23)
CALCIUM: 8 mg/dL — AB (ref 8.9–10.3)
CHLORIDE: 112 mmol/L — AB (ref 98–111)
CO2: 21 mmol/L — AB (ref 22–32)
CREATININE: 0.91 mg/dL (ref 0.61–1.24)
GFR calc Af Amer: >60 mL/min (ref >60)
GFR calc non Af Amer: >60 mL/min (ref >60)
Glucose, Bld: 96 mg/dL (ref 70–99)
Potassium: 3.3 mmol/L — ABNORMAL LOW (ref 3.5–5.1)
Sodium: 139 mmol/L (ref 135–145)

## 2018-10-23 LAB — CBC
HEMATOCRIT: 35.1 % — AB (ref 39.0–52.0)
Hemoglobin: 11.4 g/dL — ABNORMAL LOW (ref 13.0–17.0)
MCH: 29.1 pg (ref 26.0–34.0)
MCHC: 32.5 g/dL (ref 30.0–36.0)
MCV: 89.5 fL (ref 80.0–100.0)
Platelets: 454 10*3/uL — ABNORMAL HIGH (ref 150–400)
RBC: 3.92 MIL/uL — ABNORMAL LOW (ref 4.22–5.81)
RDW: 13.1 % (ref 11.5–15.5)
WBC: 16.9 10*3/uL — ABNORMAL HIGH (ref 4.0–10.5)
nRBC: 0 % (ref 0.0–0.2)

## 2018-10-23 LAB — URINALYSIS, MICROSCOPIC (REFLEX)

## 2018-10-23 LAB — GLUCOSE, CAPILLARY
GLUCOSE-CAPILLARY: 76 mg/dL (ref 70–99)
Glucose-Capillary: 95 mg/dL (ref 70–99)
Glucose-Capillary: 84 mg/dL (ref 70–99)
Glucose-Capillary: 94 mg/dL (ref 70–99)
Glucose-Capillary: 153 mg/dL — ABNORMAL HIGH (ref 70–99)

## 2018-10-23 LAB — HIV ANTIBODY (ROUTINE TESTING W REFLEX): HIV Screen 4th Generation wRfx: NONREACTIVE

## 2018-10-23 SURGERY — REPAIR, HERNIA, INGUINAL, ADULT
Anesthesia: General | Laterality: Left

## 2018-10-23 MED ORDER — KETOROLAC TROMETHAMINE 30 MG/ML IJ SOLN
30.0000 mg | Freq: Once | INTRAMUSCULAR | Status: AC | PRN
  Administered 2018-10-23: 30 mg via INTRAVENOUS
  Filled 2018-10-23: qty 1

## 2018-10-23 MED ORDER — SODIUM CHLORIDE 0.9 % IR SOLN
Status: DC | PRN
  Administered 2018-10-23: 1000 mL

## 2018-10-23 MED ORDER — HYDROCODONE-ACETAMINOPHEN 5-325 MG PO TABS
1.0000 | ORAL_TABLET | ORAL | Status: DC | PRN
  Administered 2018-10-23 (×2): 1 via ORAL
  Filled 2018-10-23 (×2): qty 1

## 2018-10-23 MED ORDER — HYDROMORPHONE HCL 1 MG/ML IJ SOLN: 0.2500 mg | INTRAMUSCULAR | Status: DC | PRN

## 2018-10-23 MED ORDER — PROPOFOL 10 MG/ML IV BOLUS
INTRAVENOUS | Status: AC
  Filled 2018-10-23: qty 20

## 2018-10-23 MED ORDER — ONDANSETRON HCL 4 MG/2ML IJ SOLN: 4.0000 mg | Freq: Once | INTRAMUSCULAR | Status: DC | PRN

## 2018-10-23 MED ORDER — LIDOCAINE 2% (20 MG/ML) 5 ML SYRINGE
INTRAMUSCULAR | Status: DC | PRN
  Administered 2018-10-23: 40 mg via INTRAVENOUS

## 2018-10-23 MED ORDER — FENTANYL CITRATE (PF) 100 MCG/2ML IJ SOLN
INTRAMUSCULAR | Status: DC | PRN
  Administered 2018-10-23: 50 ug via INTRAVENOUS
  Administered 2018-10-23 (×2): 25 ug via INTRAVENOUS

## 2018-10-23 MED ORDER — LIDOCAINE HCL (PF) 1 % IJ SOLN
INTRAMUSCULAR | Status: AC
  Filled 2018-10-23: qty 5

## 2018-10-23 MED ORDER — HYDROMORPHONE HCL 1 MG/ML IJ SOLN: 1.0000 mg | INTRAMUSCULAR | Status: DC | PRN

## 2018-10-23 MED ORDER — DEXAMETHASONE SODIUM PHOSPHATE 4 MG/ML IJ SOLN
INTRAMUSCULAR | Status: AC
  Filled 2018-10-23: qty 1

## 2018-10-23 MED ORDER — ONDANSETRON HCL 4 MG/2ML IJ SOLN
INTRAMUSCULAR | Status: AC
  Filled 2018-10-23: qty 2

## 2018-10-23 MED ORDER — BUPIVACAINE LIPOSOME 1.3 % IJ SUSP
INTRAMUSCULAR | Status: AC
  Filled 2018-10-23: qty 20

## 2018-10-23 MED ORDER — HYDROCODONE-ACETAMINOPHEN 7.5-325 MG PO TABS: 1.0000 | ORAL_TABLET | Freq: Once | ORAL | Status: DC | PRN

## 2018-10-23 MED ORDER — MIDAZOLAM HCL 2 MG/2ML IJ SOLN
INTRAMUSCULAR | Status: AC
  Filled 2018-10-23: qty 2

## 2018-10-23 MED ORDER — LACTATED RINGERS IV SOLN: INTRAVENOUS | Status: DC

## 2018-10-23 MED ORDER — LACTATED RINGERS IV SOLN
INTRAVENOUS | Status: DC | PRN
  Administered 2018-10-23: 10:00:00 via INTRAVENOUS

## 2018-10-23 MED ORDER — PROPOFOL 10 MG/ML IV BOLUS
INTRAVENOUS | Status: DC | PRN
  Administered 2018-10-23: 160 mg via INTRAVENOUS

## 2018-10-23 MED ORDER — ONDANSETRON HCL 4 MG/2ML IJ SOLN
INTRAMUSCULAR | Status: DC | PRN
  Administered 2018-10-23: 4 mg via INTRAVENOUS

## 2018-10-23 MED ORDER — MEPERIDINE HCL 50 MG/ML IJ SOLN: 6.2500 mg | INTRAMUSCULAR | Status: DC | PRN

## 2018-10-23 MED ORDER — LISINOPRIL 10 MG PO TABS
20.0000 mg | ORAL_TABLET | Freq: Every day | ORAL | Status: DC
  Administered 2018-10-24: 20 mg via ORAL
  Filled 2018-10-23 (×2): qty 2

## 2018-10-23 MED ORDER — BUPIVACAINE LIPOSOME 1.3 % IJ SUSP
INTRAMUSCULAR | Status: DC | PRN
  Administered 2018-10-23: 20 mL

## 2018-10-23 MED ORDER — MIDAZOLAM HCL 5 MG/5ML IJ SOLN
INTRAMUSCULAR | Status: DC | PRN
  Administered 2018-10-23: 2 mg via INTRAVENOUS

## 2018-10-23 MED ORDER — FENTANYL CITRATE (PF) 100 MCG/2ML IJ SOLN
INTRAMUSCULAR | Status: AC
  Filled 2018-10-23: qty 2

## 2018-10-23 MED ORDER — KETOROLAC TROMETHAMINE 30 MG/ML IJ SOLN: 30.0000 mg | Freq: Once | INTRAMUSCULAR | Status: DC

## 2018-10-23 SURGICAL SUPPLY — 35 items
CLOTH BEACON ORANGE TIMEOUT ST (SAFETY) ×3 IMPLANT
COVER LIGHT HANDLE STERIS (MISCELLANEOUS) ×6 IMPLANT
COVER WAND RF STERILE (DRAPES) ×2 IMPLANT
DERMABOND ADVANCED (GAUZE/BANDAGES/DRESSINGS) ×2
DERMABOND ADVANCED .7 DNX12 (GAUZE/BANDAGES/DRESSINGS) ×1 IMPLANT
DRAIN PENROSE 18X1/2 LTX STRL (DRAIN) ×3 IMPLANT
ELECT REM PT RETURN 9FT ADLT (ELECTROSURGICAL) ×3
ELECTRODE REM PT RTRN 9FT ADLT (ELECTROSURGICAL) ×1 IMPLANT
GAUZE 4X4 16PLY RFD (DISPOSABLE) ×2 IMPLANT
GAUZE SPONGE 4X4 12PLY STRL (GAUZE/BANDAGES/DRESSINGS) ×3 IMPLANT
GLOVE BIOGEL PI IND STRL 7.0 (GLOVE) ×2 IMPLANT
GLOVE BIOGEL PI INDICATOR 7.0 (GLOVE) ×4
GLOVE SURG SS PI 7.5 STRL IVOR (GLOVE) ×3 IMPLANT
GOWN STRL REUS W/ TWL XL LVL3 (GOWN DISPOSABLE) ×1 IMPLANT
GOWN STRL REUS W/TWL LRG LVL3 (GOWN DISPOSABLE) ×6 IMPLANT
GOWN STRL REUS W/TWL XL LVL3 (GOWN DISPOSABLE) ×2
INST SET MINOR GENERAL (KITS) ×3 IMPLANT
KIT TURNOVER KIT A (KITS) ×3 IMPLANT
MANIFOLD NEPTUNE II (INSTRUMENTS) ×3 IMPLANT
MESH HERNIA 1.6X1.9 PLUG LRG (Mesh General) IMPLANT
MESH HERNIA PLUG LRG (Mesh General) ×2 IMPLANT
NDL HYPO 21X1.5 SAFETY (NEEDLE) ×1 IMPLANT
NEEDLE HYPO 21X1.5 SAFETY (NEEDLE) ×3 IMPLANT
NS IRRIG 1000ML POUR BTL (IV SOLUTION) ×3 IMPLANT
PACK MINOR (CUSTOM PROCEDURE TRAY) ×3 IMPLANT
PAD ARMBOARD 7.5X6 YLW CONV (MISCELLANEOUS) ×3 IMPLANT
SET BASIN LINEN APH (SET/KITS/TRAYS/PACK) ×3 IMPLANT
SUT MNCRL AB 4-0 PS2 18 (SUTURE) ×3 IMPLANT
SUT NOVA NAB GS-22 2 2-0 T-19 (SUTURE) ×6 IMPLANT
SUT VIC AB 2-0 CT1 27 (SUTURE) ×2
SUT VIC AB 2-0 CT1 TAPERPNT 27 (SUTURE) ×1 IMPLANT
SUT VIC AB 3-0 SH 27 (SUTURE) ×2
SUT VIC AB 3-0 SH 27X BRD (SUTURE) ×1 IMPLANT
SUT VICRYL AB 3 0 TIES (SUTURE) ×2 IMPLANT
SYR 20CC LL (SYRINGE) ×3 IMPLANT

## 2018-10-23 NOTE — Interval H&P Note (Signed)
History and Physical Interval Note:  10/23/2018 10:36 AM  Caleb Lynch  has presented today for surgery, with the diagnosis of left inguinal hernia  The various methods of treatment have been discussed with the patient and family. After consideration of risks, benefits and other options for treatment, the patient has consented to  Procedure(s): HERNIA REPAIR INGUINAL ADULT (Left) as a surgical intervention .  The patient's history has been reviewed, patient examined, no change in status, stable for surgery.  I have reviewed the patient's chart and labs.  Questions were answered to the patient's satisfaction.     Franky Macho

## 2018-10-23 NOTE — Care Management Important Message (Signed)
Important Message  Patient Details  Name: Caleb Lynch MRN: [id number] Date of Birth: 1951-02-15   Medicare Important Message Given:  Yes    [name] 10/23/2018, 11:37 AM

## 2018-10-23 NOTE — Progress Notes (Addendum)
Patient Demographics:    Caleb Lynch, is a 67 y.o. male, DOB - 04/21/1951, UJW:[id number]  Admit date - 10/21/2018   Admitting Physician Delano Metz, MD  Outpatient Primary MD for the patient is Oval Linsey, MD  LOS - 2   Chief Complaint  Patient presents with  . Emesis        Subjective:    Caleb Lynch went to OR this am for hernia repair on left, eating solid food this evening, watery stools starting to form somewhat per patient   Assessment  & Plan :    Principal Problem:   Inguinal hernia of left side with obstruction Active Problems:   HTN (hypertension)   DM type 2 (diabetes mellitus, type 2) (HCC)   Tobacco abuse   Hypokalemia   Home meds:  - amlodipine 10 / lisinopril 20 qd/ enalapril 20 qd  - aspirin 81/ hydrocodone-aceta 5-325 qid prn/ pravastatin 40 qd  - metformin 500 bid ac  - prn nsaids/ MVI/ cyclobenzaprine prn   Brief Summary:- 67 y.o. male with medical history significant of hypertension, diabetes, left inguinal hernia admitted on 10/2018 with intractable emesis and abdominal pain secondary to colonic obstruction due to incarcerated left inguinal hernia, to OR 10/23/18 for Hernia Repair  Assess/ Plan: 1) Colonic obstruction - secondary to incarcerated left inguinal hernia. Hernia reduced itself over the weekend and patient went to OR this am 10/23/18 for hernia repair done by dr Lovell Sheehan.  Postop pt looks very good.   2) Abnormal renal CT findings suspicious for pyelo vs renal mass, also question adrenal mass / nodularity - currently on IV Zosyn.  No symptoms to suggest UTI and UA showed some ^WBC's only 6-10/ hpf. Urine cx was negative. Pt does have ^WBC that looks to be coming down.   - no urinary complaints  - will repeat UA tonight and cont IV abx for now, consider urology curbside  - MRI recommended for adrenal findings but patient politely declined , will  reconsider after he has healed up from surgery  3) DM2- hold home metformin, use Novolog/Humalog Sliding scale insulin with Accu-Cheks/Fingersticks as ordered   4) FEN - continue IV fluids  5) HTN - cont amlodipine, will resume lisinopril    Vinson Moselle MD [facility] pgr [phone] 05/14/2018, 9:25 AM  Code Status : Full   Disposition Plan  : home when stable and studies completed  Consults  :  Gen surgery   DVT Prophylaxis  :  Lovenox or SCD  Lab Results  Component Value Date   PLT 454 (H) 10/23/2018    Inpatient Medications  Scheduled Meds: . amLODipine  5 mg Oral Daily  . [START ON 10/24/2018] enoxaparin (LOVENOX) injection  40 mg Subcutaneous Q24H  . insulin aspart  0-9 Units Subcutaneous TID WC  . ketorolac  30 mg Intravenous Once  . nicotine  14 mg Transdermal Daily   Continuous Infusions: . 0.9 % NaCl with KCl 40 mEq / L 75 mL/hr (10/23/18 0606)  . piperacillin-tazobactam (ZOSYN)  IV 3.375 g (10/23/18 1408)   PRN Meds:.acetaminophen **OR** acetaminophen, hydrALAZINE, HYDROcodone-acetaminophen, HYDROmorphone (DILAUDID) injection, ketorolac, ondansetron **OR** ondansetron (ZOFRAN) IV   Anti-infectives (From admission, onward)   Start     Dose/Rate  Route Frequency Ordered Stop   10/21/18 2300  piperacillin-tazobactam (ZOSYN) IVPB 3.375 g     3.375 g 12.5 mL/hr over 240 Minutes Intravenous Every 8 hours 10/21/18 1845     10/21/18 1515  piperacillin-tazobactam (ZOSYN) IVPB 3.375 g     3.375 g 100 mL/hr over 30 Minutes Intravenous  Once 10/21/18 1501 10/21/18 1545        Objective:   Vitals:   10/23/18 1045 10/23/18 1100 10/23/18 1230 10/23/18 1400  BP: (!) 150/94 137/88 (!) 142/94 (!) 142/89  Pulse:   72 71  Resp: 15 13 (!) 9   Temp:   97.7 F (36.5 C) 98.1 F (36.7 C)  TempSrc:    Oral  SpO2: 94% 95% 93% 97%  Weight:      Height:        Wt Readings from Last 3 Encounters:  10/21/18 70.8 kg  07/20/16 83 kg  07/03/13 83.5 kg       Intake/Output Summary (Last 24 hours) at 10/23/2018 1736 Last data filed at 10/23/2018 1600 Gross per 24 hour  Intake 693.06 ml  Output 5 ml  Net 688.06 ml    Physical Exam Patient is examined daily including today on 10/22/18 , exams remain the same as of yesterday except that has changed   Gen:- Awake Alert,  In no apparent distress  HEENT:- [place].AT, No sclera icterus Neck-Supple Neck,No JVD,.  Lungs-  CTAB , good air movement CV- S1, S2 normal, regular Abd-  +ve B.Sounds, ND, Abd Soft, L groin dressing in place Extremity/Skin:- No  edema,   good pulses Psych-affect is appropriate, oriented x3 Neuro-no new focal deficits, no tremors   Data Review:   Micro Results Recent Results (from the past 240 hour(s))  Urine Culture     Status: None   Collection Time: 10/21/18  3:01 PM  Result Value Ref Range Status   Specimen Description   Final    URINE, RANDOM Performed at [facility], [address], [place], [place] [address]    Special Requests   Final    NONE Performed at [facility], [address], [place], [place] [address]    Culture   Final    NO GROWTH Performed at [facility], [address], [place], [place] [address]    Report Status 10/22/2018 FINAL  Final  Surgical pcr screen     Status: None   Collection Time: 10/22/18 11:30 AM  Result Value Ref Range Status   MRSA, PCR NEGATIVE NEGATIVE Final   Staphylococcus aureus NEGATIVE NEGATIVE Final    Comment: (NOTE) The Xpert SA Assay (FDA approved for NASAL specimens in patients 81 years of age and older), is one component of a comprehensive surveillance program. It is not intended to diagnose infection nor to guide or monitor treatment. Performed at [facility], [address] [place], [place] [address]     Radiology Reports Ct Abdomen Pelvis W Contrast  Result Date: 10/21/2018 CLINICAL DATA:  Nausea and vomiting for 1 week.  Weakness fatigue. EXAM: CT ABDOMEN AND PELVIS WITH CONTRAST TECHNIQUE:  Multidetector CT imaging of the abdomen and pelvis was performed using the standard protocol following bolus administration of intravenous contrast. CONTRAST:  ISOVUE-300 IOPAMIDOL (ISOVUE-300) INJECTION 61% COMPARISON:  None. FINDINGS: Lower chest: Lung bases are clear. Hepatobiliary: No focal hepatic lesion. No biliary duct dilatation. Gallbladder is normal. Common bile duct is normal. Pancreas: Pancreas is normal. No ductal dilatation. No pancreatic inflammation. Spleen: Normal spleen Adrenals/urinary tract:  Nodular thickening of the LEFT adrenal gland. The lateral limb of the LEFT adrenal gland measures 10 mm. The medial limb measures 12 mm. RIGHT adrenal gland normal. There is ill-defined rounded region of hypoperfusion in the upper pole of the LEFT kidney measuring 2.4 cm (image 25/2). Second lesion in the posterior more superior aspect of the upper pole LEFT kidney is similar measuring 2.2 cm (image 22/2). Benign cyst in the LEFT renal hilum. There is a region of hypoperfusion in the upper pole of the RIGHT kidney which less well-defined (image 27/2. Potential region of hypoperfusion in the lower pole of the RIGHT kidney Stomach/Bowel: Stomach, duodenum and small bowel are normal. The cecum is fluid-filled. Appendix is not identified. There is pericolonic inflammation along the ascending colon. The transverse colon is fluid-filled. The descending colon is distended and fluid-filled. The descending colon enters LEFT inguinal hernia. Proximal to the descending colon entering the hernia the diameter measures 5.3 cm. Upon exiting the LEFT inguinal hernia, the bowel diameter is 1.6 cm and collapsed. The more distal sigmoid colon and rectum are near complete collapse. Findings are consistent with high-grade mechanical obstruction of the descending colon upon entering the LEFT inguinal hernia. Vascular/Lymphatic: Abdominal aorta is normal caliber with atherosclerotic calcification. There is no retroperitoneal  or periportal lymphadenopathy. No pelvic lymphadenopathy. Reproductive: Prostate normal Other: Small free fluid the pelvis. Musculoskeletal: No aggressive osseous lesion. IMPRESSION: 1. High-grade mechanical obstruction of the descending secondary to entering and exiting a LEFT inguinal hernia. Obstruction occurs within the hernia sac. 2. Fluid-filled colon from the level of LEFT inguinal obstruction back through to the cecum. There is inflammation along the ascending colon. No perforation or pneumatosis. 3. Free fluid in the pelvis related to obstruction. 4. There bilateral regions of hypoperfusion in LEFT and RIGHT kidney. The regions in the LEFT kidney are more well-defined. Favor multifocal bilateral pyelonephritis over LEFT renal neoplasm. Recommend correlation with urinalysis and leukocytosis. 5. Nodular thickening of the LEFT adrenal gland is indeterminate. Recommend adrenal protocol MRI at some point to evaluate these LEFT adrenal lesions. Findings conveyed toJOSEPH ZAMMIT on 10/21/2018  at14:50. Electronically Signed   By: Genevive Bi M.D.   On: 10/21/2018 14:50   Dg Abd Acute W/chest  Result Date: 10/21/2018 CLINICAL DATA:  Vomiting for the past 3-4 weeks. EXAM: DG ABDOMEN ACUTE W/ 1V CHEST COMPARISON:  None. FINDINGS: The heart size and mediastinal contours are within normal limits. Normal pulmonary vascularity. Chronic bronchitic changes, likely smoking-related. Small focal opacity at the peripheral right lung base with adjacent linear scarring. Mild dilatation of the transverse, descending, and sigmoid colon. Air-fluid levels seen in the transverse and descending colon. No small bowel dilatation. There is no evidence of free intraperitoneal air. No radiopaque calculi or other significant radiographic abnormality is seen. No acute osseous abnormality. IMPRESSION: 1. Mildly dilated transverse and left colon with air-fluid levels, concerning for large bowel obstruction. Recommend CT of the abdomen  pelvis with contrast for further evaluation. 2. Small focal opacity in the peripheral right lung base is favored to reflect scarring/atelectasis. Electronically Signed   By: Obie Dredge M.D.   On: 10/21/2018 11:49     CBC Recent Labs  Lab 10/21/18 1127 10/21/18 1129 10/22/18 0616 10/23/18 0508  WBC 21.8*  --  18.3* 16.9*  HGB 13.2  --  12.1* 11.4*  HCT 41.3  --  37.6* 35.1*  PLT 558*  --  543* 454*  MCV 90.8  --  90.6 89.5  MCH 29.0  --  29.2 29.1  MCHC 32.0  --  32.2 32.5  RDW 13.1  --  12.9 13.1  LYMPHSABS  --  1.2  --   --   MONOABS  --  0.9  --   --   EOSABS  --  0.0  --   --   BASOSABS  --  0.0  --   --     Chemistries  Recent Labs  Lab 10/21/18 1127 10/22/18 0616 10/23/18 0508  NA 135 140 139  K 3.0* 3.2* 3.3*  CL 102 110 112*  CO2 22 22 21*  GLUCOSE 131* 94 96  BUN 22 20 13   CREATININE 1.07 1.08 0.91  CALCIUM 8.7* 8.4* 8.0*  AST 15  --   --   ALT 16  --   --   ALKPHOS 69  --   --   BILITOT 0.4  --   --    ------------------------------------------------------------------------------------------------------------------ No results for input(s): CHOL, HDL, LDLCALC, TRIG, CHOLHDL, LDLDIRECT in the last 72 hours.  No results found for: HGBA1C ------------------------------------------------------------------------------------------------------------------ Recent Labs    10/22/18 0616  TSH 1.771   ------------------------------------------------------------------------------------------------------------------ No results for input(s): VITAMINB12, FOLATE, FERRITIN, TIBC, IRON, RETICCTPCT in the last 72 hours.  Coagulation profile No results for input(s): INR, PROTIME in the last 168 hours.  No results for input(s): DDIMER in the last 72 hours.  Cardiac Enzymes No results for input(s): CKMB, TROPONINI, MYOGLOBIN in the last 168 hours.  Invalid input(s):  CK ------------------------------------------------------------------------------------------------------------------ No results found for: BNP   [facility] - Office  [phone]

## 2018-10-23 NOTE — Anesthesia Postprocedure Evaluation (Signed)
Anesthesia Post Note  Patient: Caleb Lynch  Procedure(s) Performed: LEFT INGUINAL HERNIORRHAPY  WITH MESH (Left )  Patient location during evaluation: PACU Anesthesia Type: General Level of consciousness: awake and alert and oriented Pain management: pain level controlled Vital Signs Assessment: post-procedure vital signs reviewed and stable Respiratory status: spontaneous breathing Cardiovascular status: blood pressure returned to baseline and stable Postop Assessment: no apparent nausea or vomiting Anesthetic complications: no     Last Vitals:  Vitals:   10/23/18 1100 10/23/18 1230  BP: 137/88 (!) 142/94  Pulse:  72  Resp: 13 (!) 9  Temp:  36.5 C  SpO2: 95% 93%    Last Pain:  Vitals:   10/23/18 1230  TempSrc:   PainSc: 0-No pain                 Karisa Nesser

## 2018-10-23 NOTE — Op Note (Signed)
Patient:  Caleb Lynch  DOB:  [birth year]  MRN:  [id number]   Preop Diagnosis: Left inguinal hernia  Postop Diagnosis: Same  Procedure: Left inguinal herniorrhaphy with mesh  Surgeon: Franky Macho, MD  Anes: General  Indications: Patient is a 67 year old white male who presents for left inguinal hernia.  He has had intermittent incarceration recently.  The risks and benefits of the procedure including bleeding, infection, mesh use, and the possibility of recurrence of the hernia were fully explained to the patient, who gave informed consent.  Procedure note: Patient was placed in supine position.  After general anesthesia was administered, the left groin region was prepped and draped using usual sterile technique with Betadine.  Surgical site confirmation was performed.  A transverse incision was made in the left groin region down to the external oblique aponeurosis.  The aponeurosis was incised to the external ring.  A Penrose drain was placed around the spermatic cord.  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  Some of the tissue was edematous in this region.  I was able to free away the indirect hernia sac from the spermatic cord up to the peritoneal reflection and inverted.  A large Bard PerFix plug was then inserted.  An onlay patch was then placed along the floor of the inguinal canal and secured superiorly to the conjoined tendon and inferiorly to the shelving edge of Poupart's ligament using 2-0 Novafil interrupted sutures.  The internal ring was re-created using a 2-0 Novafil interrupted suture.  The external oblique aponeurosis was reapproximated using a 2-0 Vicryl running suture.  The subcutaneous layer was reapproximated using 3-0 Vicryl interrupted suture.  Exparel was instilled into the surrounding wound.  The skin was closed using a 4-0 Monocryl subcuticular suture.   Dermabond was applied.  All tape and needle counts were correct at the end of the procedure.  Patient was awakened and transferred to PACU in stable condition.  Complications: None  EBL: Minimal  Specimen: None

## 2018-10-23 NOTE — Transfer of Care (Signed)
Immediate Anesthesia Transfer of Care Note  Patient: Caleb Lynch  Procedure(s) Performed: LEFT INGUINAL HERNIA REPAIR WITH MESH (Left )  Patient Location: PACU  Anesthesia Type:General  Level of Consciousness: awake and patient cooperative  Airway & Oxygen Therapy: Patient Spontanous Breathing and Patient connected to nasal cannula oxygen  Post-op Assessment: Report given to RN, Post -op Vital signs reviewed and stable and Patient moving all extremities  Post vital signs: Reviewed and stable  Last Vitals:  Vitals Value Taken Time  BP    Temp    Pulse    Resp    SpO2      Last Pain:  Vitals:   10/23/18 0434  TempSrc: Oral  PainSc:       Patients Stated Pain Goal: 2 (10/22/18 1445)  Complications: No apparent anesthesia complications

## 2018-10-23 NOTE — Progress Notes (Signed)
Initial Nutrition Assessment  DOCUMENTATION CODES:      INTERVENTION:  Regular diet   Ensure Enlive po BID, each supplement provides 350 kcal and 20 grams of protein   NUTRITION DIAGNOSIS:   Inadequate oral intake related to vomiting(persisting the past 3-4 weeks) as evidenced by percent weight loss(approximately 20 lb loss reported by pt).   GOAL:   Patient will meet greater than or equal to 90% of their needs  MONITOR:   PO intake, Supplement acceptance, Weight trends, Labs  REASON FOR ASSESSMENT:   Malnutrition Screening Tool    ASSESSMENT:  Patient is a 67 yo male with hx of DM-2, HTN, Chronic back pain. Renal mass and left adrenal lesions. MRI pending? Daily cigarette smoker x 40 years. He is from home- lives with wife. Patient says he has been unable to eat without vomiting most of the past month. Hypokalemia on admission. Today he is s/p left inguinal herniorrhaphy.   Patient's diet is fully advanced now and he has not vomited since admission. Patient says he is feeling like he can eat. Patient has poor dentition.     Acute weight loss the past 3-4 weeks prior to hospitalization associated with poor tolerance of oral intake-persistent vomiting. He reports a usual wt of 174-178 lbs at his last visit to Dr Olevia Bowens. Weight hx in Epic limited.  Medications reviewed and include: Norvasc, SSI, Nicoderm  Labs: BMP Latest Ref Rng & Units 10/23/2018 10/22/2018 10/21/2018  Glucose 70 - 99 mg/dL 96 94 366(Y)  BUN 8 - 23 mg/dL 13 20 22   Creatinine 0.61 - 1.24 mg/dL 4.03 4.74 2.59  Sodium 135 - 145 mmol/L 139 140 135  Potassium 3.5 - 5.1 mmol/L 3.3(L) 3.2(L) 3.0(L)  Chloride 98 - 111 mmol/L 112(H) 110 102  CO2 22 - 32 mmol/L 21(L) 22 22  Calcium 8.9 - 10.3 mg/dL 8.0(L) 8.4(L) 8.7(L)     NUTRITION - FOCUSED PHYSICAL EXAM: mild temporal, clavicle muscle loss  Diet Order:   Diet Order            Diet regular Room service appropriate? Yes; Fluid consistency: Thin  Diet  effective now              EDUCATION NEEDS:   Education needs have been addressed Skin:  Skin Assessment: Reviewed RN Assessment(surgical incision)  Last BM:  10/29  Height:   Ht Readings from Last 1 Encounters:  10/21/18 5\' 10"  (1.778 m)    Weight:   Wt Readings from Last 1 Encounters:  10/21/18 70.8 kg    Ideal Body Weight:  75 kg  BMI:  Body mass index is 22.38 kg/m.  Estimated Nutritional Needs:   Kcal:  2130-2272   Protein:  91-95 gr  Fluid:  2.1-2.2 liters daily  Royann Shivers MS,RD,CSG,LDN Office: [phone] Pager: [phone]

## 2018-10-23 NOTE — Anesthesia Procedure Notes (Signed)
Procedure Name: LMA Insertion Date/Time: 10/23/2018 11:19 AM Performed by: Despina Hidden, CRNA Pre-anesthesia Checklist: Patient identified, Patient being monitored, Emergency Drugs available, Timeout performed and Suction available Patient Re-evaluated:Patient Re-evaluated prior to induction Oxygen Delivery Method: Circle System Utilized Preoxygenation: Pre-oxygenation with 100% oxygen Induction Type: IV induction Ventilation: Mask ventilation without difficulty LMA: LMA inserted LMA Size: 4.0 Number of attempts: 1 Placement Confirmation: positive ETCO2 and breath sounds checked- equal and bilateral Tube secured with: Tape Dental Injury: Teeth and Oropharynx as per pre-operative assessment

## 2018-10-23 NOTE — Anesthesia Preprocedure Evaluation (Signed)
Anesthesia Evaluation  Patient identified by MRN, date of birth, ID band Patient awake    Reviewed: Allergy & Precautions, H&P , NPO status , Patient's Chart, lab work & pertinent test results, reviewed documented beta blocker date and time   Airway Mallampati: II  TM Distance: >3 FB Neck ROM: full    Dental  (+) Poor Dentition, Missing, Chipped, Dental Advidsory Given   Pulmonary neg pulmonary ROS, Current Smoker,    Pulmonary exam normal breath sounds clear to auscultation       Cardiovascular Exercise Tolerance: Good hypertension, negative cardio ROS   Rhythm:regular Rate:Normal     Neuro/Psych negative neurological ROS  negative psych ROS   GI/Hepatic negative GI ROS, Neg liver ROS,   Endo/Other  negative endocrine ROSdiabetes  Renal/GU negative Renal ROS  negative genitourinary   Musculoskeletal   Abdominal   Peds  Hematology negative hematology ROS (+)   Anesthesia Other Findings   Reproductive/Obstetrics negative OB ROS                             Anesthesia Physical Anesthesia Plan  ASA: III  Anesthesia Plan: General   Post-op Pain Management:    Induction:   PONV Risk Score and Plan:   Airway Management Planned:   Additional Equipment:   Intra-op Plan:   Post-operative Plan:   Informed Consent: I have reviewed the patients History and Physical, chart, labs and discussed the procedure including the risks, benefits and alternatives for the proposed anesthesia with the patient or authorized representative who has indicated his/her understanding and acceptance.   Dental Advisory Given  Plan Discussed with: CRNA  Anesthesia Plan Comments:         Anesthesia Quick Evaluation

## 2018-10-24 ENCOUNTER — Encounter (HOSPITAL_COMMUNITY): Payer: Self-pay | Admitting: General Surgery

## 2018-10-24 DIAGNOSIS — Z720 Tobacco use: Secondary | ICD-10-CM

## 2018-10-24 DIAGNOSIS — E278 Other specified disorders of adrenal gland: Secondary | ICD-10-CM | POA: Diagnosis present

## 2018-10-24 DIAGNOSIS — R93429 Abnormal radiologic findings on diagnostic imaging of unspecified kidney: Secondary | ICD-10-CM | POA: Diagnosis present

## 2018-10-24 LAB — CBC
HEMATOCRIT: 36 % — AB (ref 39.0–52.0)
HEMOGLOBIN: 11.3 g/dL — AB (ref 13.0–17.0)
MCH: 28.5 pg (ref 26.0–34.0)
MCHC: 31.4 g/dL (ref 30.0–36.0)
MCV: 90.9 fL (ref 80.0–100.0)
Platelets: 433 10*3/uL — ABNORMAL HIGH (ref 150–400)
RBC: 3.96 MIL/uL — ABNORMAL LOW (ref 4.22–5.81)
RDW: 13.2 % (ref 11.5–15.5)
WBC: 16.6 10*3/uL — ABNORMAL HIGH (ref 4.0–10.5)
nRBC: 0 % (ref 0.0–0.2)

## 2018-10-24 LAB — GLUCOSE, CAPILLARY
GLUCOSE-CAPILLARY: 109 mg/dL — AB (ref 70–99)
Glucose-Capillary: 110 mg/dL — ABNORMAL HIGH (ref 70–99)

## 2018-10-24 LAB — BASIC METABOLIC PANEL
ANION GAP: 6 (ref 5–15)
BUN: 15 mg/dL (ref 8–23)
CALCIUM: 8 mg/dL — AB (ref 8.9–10.3)
CO2: 21 mmol/L — ABNORMAL LOW (ref 22–32)
CREATININE: 0.99 mg/dL (ref 0.61–1.24)
Chloride: 113 mmol/L — ABNORMAL HIGH (ref 98–111)
GFR calc Af Amer: >60 mL/min (ref >60)
GFR calc non Af Amer: >60 mL/min (ref >60)
GLUCOSE: 97 mg/dL (ref 70–99)
Potassium: 3.2 mmol/L — ABNORMAL LOW (ref 3.5–5.1)
Sodium: 140 mmol/L (ref 135–145)

## 2018-10-24 MED ORDER — CIPROFLOXACIN HCL 500 MG PO TABS: 500.0000 mg | ORAL_TABLET | Freq: Two times a day (BID) | ORAL | 0 refills | Status: AC

## 2018-10-24 MED ORDER — HYDROCODONE-ACETAMINOPHEN 5-325 MG PO TABS: 1.0000 | ORAL_TABLET | ORAL | 0 refills | Status: AC | PRN

## 2018-10-24 NOTE — Discharge Summary (Signed)
Physician Discharge Summary  Patient ID: Von Quintanar MRN: [id number] DOB/AGE: [birth year] 67 y.o.  Admit date: 10/21/2018 Discharge date: 10/24/2018  Admission Diagnoses:  Principal Problem:   Inguinal hernia of left side with obstruction Active Problems:   HTN (hypertension)   DM type 2 (diabetes mellitus, type 2) (HCC)   Tobacco abuse   Hypokalemia  Discharge Diagnoses:   Left inguinal hernia sp repair  Colon obstruction, resolved  Abnormal CT of kidneys  Abnormal CT left adrenal gland  HTN  DM2  Tobacco  Discharged Condition: good  Presentation Summary: Caleb Lynch is a 67 y.o. male with medical history significant of hypertension, diabetes, left inguinal hernia, who is been feeling nauseous and vomiting intermittently for the past several weeks.  For the past 3 to 4 days, he has had persistent vomiting.  He is been able to keep anything down by mouth.  He basically thought was up everything that he eats.  His last bowel movement was several days ago.  He is passing flatus.  He has not had any fever.  No shortness of breath or cough.  No dysuria.  No flank pain.  He does describe some discomfort in his lower abdomen.  He denies any previous abdominal surgeries ED Course: Patient was evaluated in the emergency room for vitals were noted to be stable.  WBC count noted to be elevated at 21.  Urinalysis shows 11-20 WBCs with no bacteria.  CT scan of the abdomen and pelvis indicated high-grade mechanical obstruction of the descending colon secondary to entering and exiting left inguinal hernia.  Obstruction occurs within the hernia sac.  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   Hospital Course:   1) L inguinal hernia - underwent successful repair of hernia on 10/23/18 by Dr Lovell Sheehan. Transient colonic obstruction due to  incarceration resolved on it's own prior to surgery.   Postop course was good pt ready for dc.  Will f/u w gen surg in 1 week.   2) Abnormal CT bilat kidneys - not sure UTI / pyelo vs renal mass vs other -- per CT report > "there are bilateral regions of hypoperfusion in LEFT and RIGHT kidney. The regions in the LEFT kidney are more well-defined. Favor multifocal bilateral pyelonephritis over LEFT renal neoplasm. Recommend correlation with urinalysis and leukocytosis." - UA was + for mild pyuria but urine culture negative, pt had no UTI / pyelo symptoms   - got 3 days IV abx here will cont abx w/ po cipro 500 bid x 4 more days at home then recommend f/u w/ PCP and do repeat UA +/- renal imaging to f/u abnormalities on CT  3) Abnormal left adrenal gland by CT -  -- per CT report >> "Nodular thickening of the LEFT adrenal gland is indeterminate. Recommend adrenal protocol MRI at some point to evaluate these LEFT adrenal lesions." - recommend f/u w/ PCP to consider further adrenal imaging  4) DM2- resume home DM medications at dc  5) HTN - resume home amlodipine and lisinopril   Consults: general surgery  Treatments: L inguinal hernia repair on 10/23/18 by Dr Lovell Sheehan  Discharge Exam: Blood pressure (!) 153/87, pulse 67, temperature 98.5 F (36.9 C), resp. rate 18, height 5\' 10"  (1.778 m), weight 70.8 kg, SpO2 94 %.  alert, no distress, calm and cheerful  no jvd  chest cta  bilat  abd soft ntnd L inguinal region dressed  ext no edema  nf, Ox 3  Disposition: Discharge disposition: 01-Home or Self Care        Allergies as of 10/24/2018   No Known Allergies     Medication List    STOP taking these medications   enalapril 20 MG tablet Commonly known as:  VASOTEC   predniSONE 10 MG tablet Commonly known as:  DELTASONE     TAKE these medications   amLODipine 10 MG tablet Commonly known as:  NORVASC Take 1 tablet by mouth daily.   aspirin 81 MG chewable tablet Chew 81  mg by mouth daily.   ciprofloxacin 500 MG tablet Commonly known as:  CIPRO Take 1 tablet (500 mg total) by mouth 2 (two) times daily for 4 days.   cyclobenzaprine 5 MG tablet Commonly known as:  FLEXERIL Take 1 tablet (5 mg total) by mouth 3 (three) times daily as needed for muscle spasms.   HYDROcodone-acetaminophen 5-325 MG tablet Commonly known as:  NORCO/VICODIN Take one-two tabs po q 4-6 hrs prn pain What changed:  Another medication with the same name was added. Make sure you understand how and when to take each.   HYDROcodone-acetaminophen 5-325 MG tablet Commonly known as:  NORCO/VICODIN Take 1 tablet by mouth every 4 (four) hours as needed for moderate pain. What changed:  You were already taking a medication with the same name, and this prescription was added. Make sure you understand how and when to take each.   lisinopril 20 MG tablet Commonly known as:  PRINIVIL,ZESTRIL Take 1 tablet by mouth daily.   metFORMIN 500 MG (MOD) 24 hr tablet Commonly known as:  GLUMETZA Take 500 mg by mouth 2 (two) times daily with a meal.   multivitamin with minerals Tabs tablet Take 1 tablet by mouth daily.   naproxen 500 MG tablet Commonly known as:  NAPROSYN Take 1 tablet (500 mg total) by mouth 2 (two) times daily with a meal.   pravastatin 40 MG tablet Commonly known as:  PRAVACHOL Take 40 mg by mouth daily.      Follow-up Information    Franky Macho, MD. Schedule an appointment as soon as possible for a visit on 10/31/2018.   Specialty:  General Surgery Contact information: [address] [place] [address] [phone]           Signed: Maree Krabbe 10/24/2018, 10:05 AM

## 2018-10-24 NOTE — Discharge Instructions (Signed)
Open Hernia Repair, Adult  Open hernia repair is a surgical procedure to fix a hernia. A hernia occurs when an internal organ or tissue pushes out through a weak spot in the abdominal wall muscles. Hernias commonly occur in the groin and around the navel.  Most hernias tend to get worse over time. Often, surgery is done to prevent the hernia from becoming bigger, uncomfortable, or an emergency. Emergency surgery may be needed if abdominal contents get stuck in the opening (incarcerated hernia) or the blood supply gets cut off (strangulated hernia). In an open repair, an incision is made in the abdomen to perform the surgery.  Tell a health care provider about:   Any allergies you have.   All medicines you are taking, including vitamins, herbs, eye drops, creams, and over-the-counter medicines.   Any problems you or family members have had with anesthetic medicines.   Any blood or bone disorders you have.   Any surgeries you have had.   Any medical conditions you have, including any recent cold or flu symptoms.   Whether you are pregnant or may be pregnant.  What are the risks?  Generally, this is a safe procedure. However, problems may occur, including:   Long-lasting (chronic) pain.   Bleeding.   Infection.   Damage to the testicle. This can cause shrinking or swelling.   Damage to the bladder, blood vessels, intestine, or nerves near the hernia.   Trouble passing urine.   Allergic reactions to medicines.   Return of the hernia.    What happens before the procedure?  Staying hydrated  Follow instructions from your health care provider about hydration, which may include:   Up to 2 hours before the procedure - you may continue to drink clear liquids, such as water, clear fruit juice, black coffee, and plain tea.    Eating and drinking restrictions  Follow instructions from your health care provider about eating and drinking, which may include:   8 hours before the procedure - stop eating heavy meals  or foods such as meat, fried foods, or fatty foods.   6 hours before the procedure - stop eating light meals or foods, such as toast or cereal.   6 hours before the procedure - stop drinking milk or drinks that contain milk.   2 hours before the procedure - stop drinking clear liquids.    Medicines   Ask your health care provider about:  ? Changing or stopping your regular medicines. This is especially important if you are taking diabetes medicines or blood thinners.  ? Taking medicines such as aspirin and ibuprofen. These medicines can thin your blood. Do not take these medicines before your procedure if your health care provider instructs you not to.   You may be given antibiotic medicine to help prevent infection.  General instructions   You may have blood tests or imaging studies.   Ask your health care provider how your surgical site will be marked or identified.   If you smoke, do not smoke for at least 2 weeks before your procedure or for as long as told by your health care provider.   Let your health care provider know if you develop a cold or any infection before your surgery.   Plan to have someone take you home from the hospital or clinic.   If you will be going home right after the procedure, plan to have someone with you for 24 hours.  What happens during the procedure?     To reduce your risk of infection:  ? Your health care team will wash or sanitize their hands.  ? Your skin will be washed with soap.  ? Hair may be removed from the surgical area.   An IV tube will be inserted into one of your veins.   You will be given one or more of the following:  ? A medicine to help you relax (sedative).  ? A medicine to numb the area (local anesthetic).  ? A medicine to make you fall asleep (general anesthetic).   Your surgeon will make an incision over the hernia.   The tissues of the hernia will be moved back into place.   The edges of the hernia may be stitched together.   The opening in the  abdominal muscles will be closed with stitches (sutures). Or, your surgeon will place a mesh patch made of manmade (synthetic) material over the opening.   The incision will be closed.   A bandage (dressing) may be placed over the incision.  The procedure may vary among health care providers and hospitals.  What happens after the procedure?   Your blood pressure, heart rate, breathing rate, and blood oxygen level will be monitored until the medicines you were given have worn off.   You may be given medicine for pain.   Do not drive for 24 hours if you received a sedative.  This information is not intended to replace advice given to you by your health care provider. Make sure you discuss any questions you have with your health care provider.  Document Released: 06/01/2001 Document Revised: 06/25/2016 Document Reviewed: 05/19/2016  Elsevier Interactive Patient Education  2018 [employer]

## 2018-10-24 NOTE — Progress Notes (Signed)
Pharmacy Antibiotic Note  Caleb Lynch is a 67 y.o. male admitted on 10/21/2018 with  intra-abdominal infection.  Pharmacy has been consulted for Zosyn dosing.   Plan: Continue Zosyn 3.375g IV q8h (4-hr infusion) Monitor labs, c/s, and patient improvement.  Height: 5\' 10"  (177.8 cm) Weight: 156 lb (70.8 kg) IBW/kg (Calculated) : 73  Temp (24hrs), Avg:98.1 F (36.7 C), Min:97.7 F (36.5 C), Max:98.5 F (36.9 C)  Recent Labs  Lab 10/21/18 1127 10/22/18 0616 10/23/18 0508 10/24/18 0424  WBC 21.8* 18.3* 16.9* 16.6*  CREATININE 1.07 1.08 0.91 0.99    Estimated Creatinine Clearance: 73.5 mL/min (by C-G formula based on SCr of 0.99 mg/dL).    No Known Allergies  Antimicrobials this admission: Zosyn 11/2 >>       Microbiology results: 11/2 UCx: in progress    Thank you for allowing pharmacy to be a part of this patient's care.  Tad Moore 10/24/2018 8:53 AM

## 2018-10-24 NOTE — Progress Notes (Signed)
1 Day Post-Op  Subjective: Patient denies any incisional pain.  Objective: Vital signs in last 24 hours: Temp:  [97.7 F (36.5 C)-98.5 F (36.9 C)] 98.5 F (36.9 C) (11/05 0547) Pulse Rate:  [67-72] 67 (11/05 0547) Resp:  [9-18] 18 (11/05 0547) BP: (137-154)/(82-94) 153/87 (11/05 0547) SpO2:  [93 %-98 %] 94 % (11/05 0547) Last BM Date: 10/23/18  Intake/Output from previous day: 11/04 0701 - 11/05 0700 In: 2885.5 [P.O.:240; I.V.:2440.1; IV Piggyback:205.4] Out: 5 [Blood:5] Intake/Output this shift: No intake/output data recorded.  General appearance: alert, cooperative and no distress GI: soft, non-tender; bowel sounds normal; no masses,  no organomegaly and Left groin incision healing well.  Lab Results:  Recent Labs    10/23/18 0508 10/24/18 0424  WBC 16.9* 16.6*  HGB 11.4* 11.3*  HCT 35.1* 36.0*  PLT 454* 433*   BMET Recent Labs    10/23/18 0508 10/24/18 0424  NA 139 140  K 3.3* 3.2*  CL 112* 113*  CO2 21* 21*  GLUCOSE 96 97  BUN 13 15  CREATININE 0.91 0.99  CALCIUM 8.0* 8.0*   PT/INR No results for input(s): LABPROT, INR in the last 72 hours.  Studies/Results: No results found.  Anti-infectives: Anti-infectives (From admission, onward)   Start     Dose/Rate Route Frequency Ordered Stop   10/21/18 2300  piperacillin-tazobactam (ZOSYN) IVPB 3.375 g     3.375 g 12.5 mL/hr over 240 Minutes Intravenous Every 8 hours 10/21/18 1845     10/21/18 1515  piperacillin-tazobactam (ZOSYN) IVPB 3.375 g     3.375 g 100 mL/hr over 30 Minutes Intravenous  Once 10/21/18 1501 10/21/18 1545      Assessment/Plan: s/p Procedure(s): LEFT INGUINAL HERNIORRHAPY  WITH MESH Impression: Doing well from surgical standpoint.  Okay for discharge.  Will follow-up in my office next week.  LOS: 3 days    Caleb Lynch 10/24/2018

## 2018-10-31 ENCOUNTER — Ambulatory Visit (INDEPENDENT_AMBULATORY_CARE_PROVIDER_SITE_OTHER): Payer: Self-pay | Admitting: General Surgery

## 2018-10-31 ENCOUNTER — Encounter: Payer: Self-pay | Admitting: General Surgery

## 2018-10-31 VITALS — BP 129/76 | HR 67 | Temp 97.7°F | Resp 18 | Wt 161.2 lb

## 2018-10-31 DIAGNOSIS — Z09 Encounter for follow-up examination after completed treatment for conditions other than malignant neoplasm: Secondary | ICD-10-CM

## 2018-10-31 NOTE — Progress Notes (Signed)
Subjective:     Caleb Lynch  Here for postoperative visit.  Doing well.  Has no complaints.  Minimal incisional pain. Objective:    BP 129/76 (BP Location: Left Arm, Patient Position: Sitting, Cuff Size: Normal)   Pulse 67   Temp 97.7 F (36.5 C) (Temporal)   Resp 18   Wt 161 lb 3.2 oz (73.1 kg)   BMI 23.13 kg/m   General:  alert, cooperative and no distress  Left inguinal incision healing well.  No hematoma or seroma present.     Assessment:    Doing well postoperatively.    Plan:   Increase activity as able.  Follow-up here as needed.

## 2020-03-20 IMAGING — CT CT ABD-PELV W/ CM
2 of 5 series · 15 of 46 positions shown, 17 images · IV contrast (Isovue)
Comparison: None.

CLINICAL DATA: Nausea and vomiting for 1 week.  Weakness fatigue.

EXAM:
CT ABDOMEN AND PELVIS WITH CONTRAST
TECHNIQUE: Multidetector CT imaging of the abdomen and pelvis was performed
using the standard protocol following bolus administration of
intravenous contrast.
CONTRAST:  100mL [id number] IOPAMIDOL ([id number]) INJECTION 61%

[Series 2: axial st · axial · 0.68mm/px · z∈[-615,-190]mm · 12 of 97 slices shown, 14 images]
[im 6/97  soft-tissue]
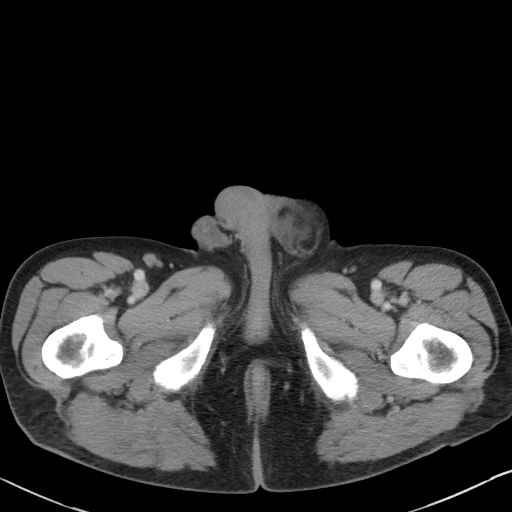
[im 6/97  bone]
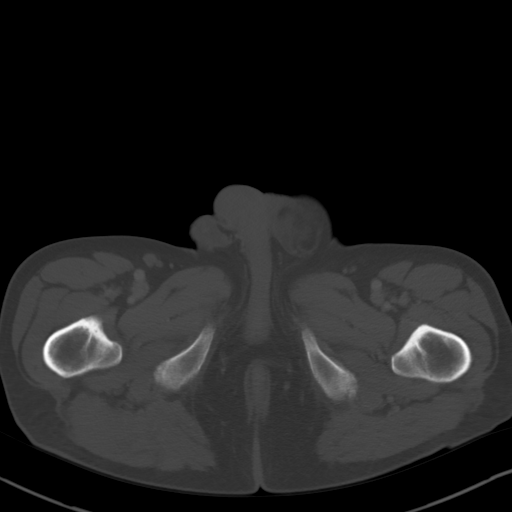
[im 16/97  soft-tissue]
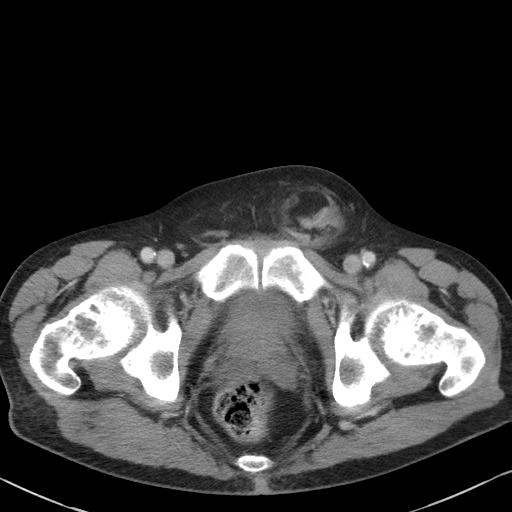
[im 21/97  soft-tissue]
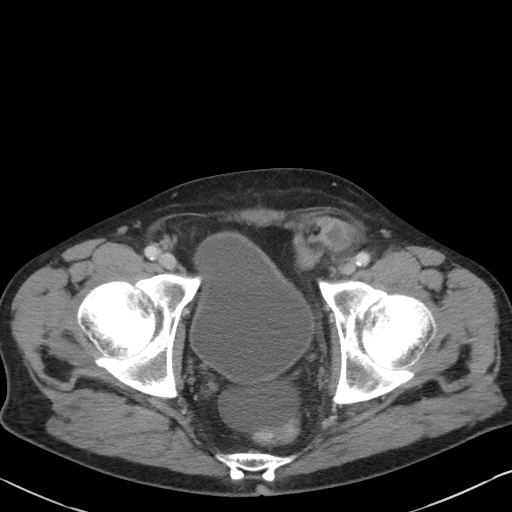
[im 31/97  soft-tissue]
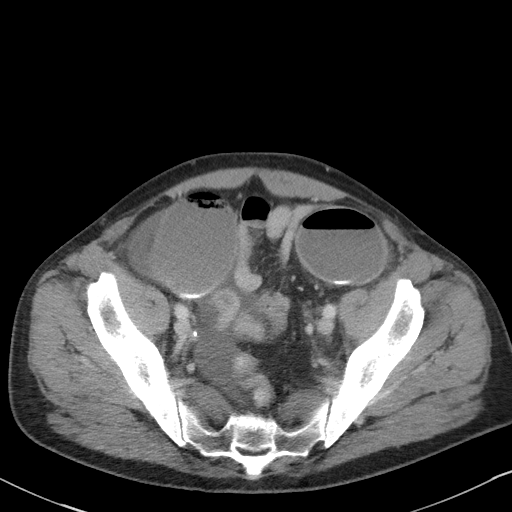
[im 36/97  soft-tissue]
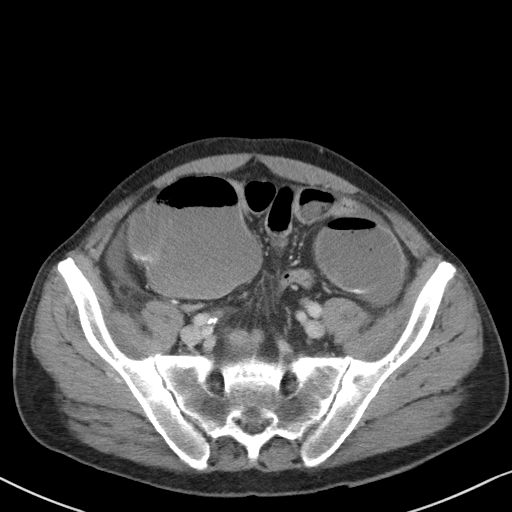
[im 46/97  soft-tissue]
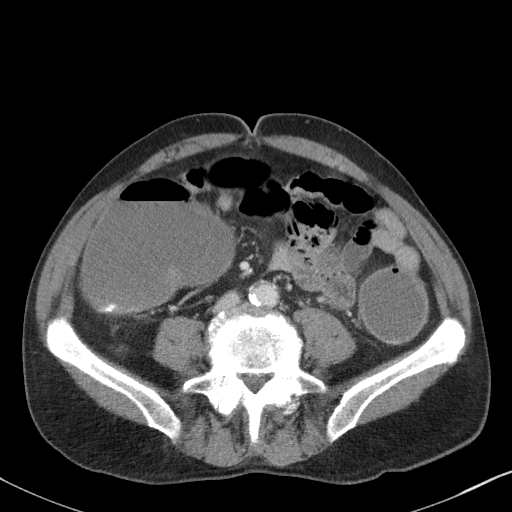
[im 51/97  soft-tissue]
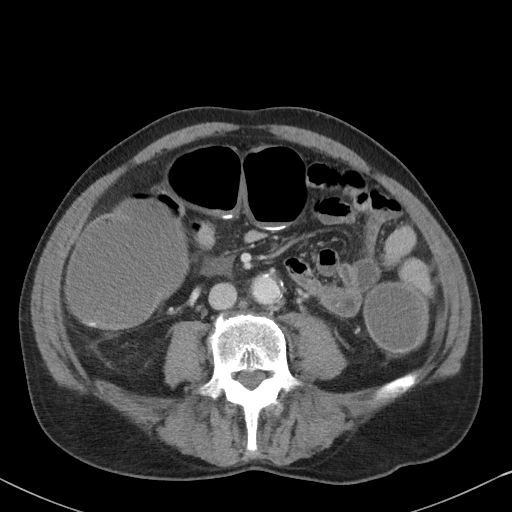
[im 61/97  soft-tissue]
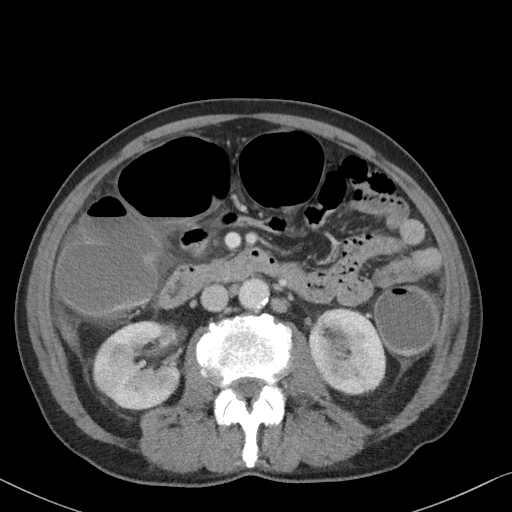
[im 66/97  soft-tissue]
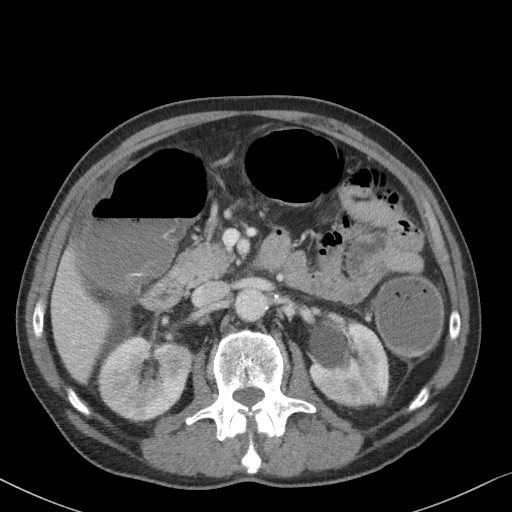
[im 66/97  bone]
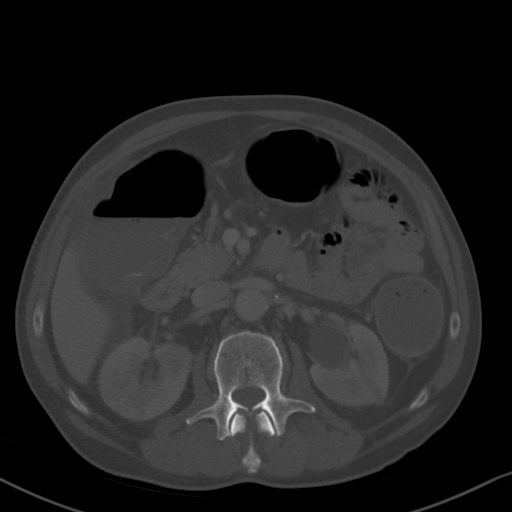
[im 76/97  soft-tissue]
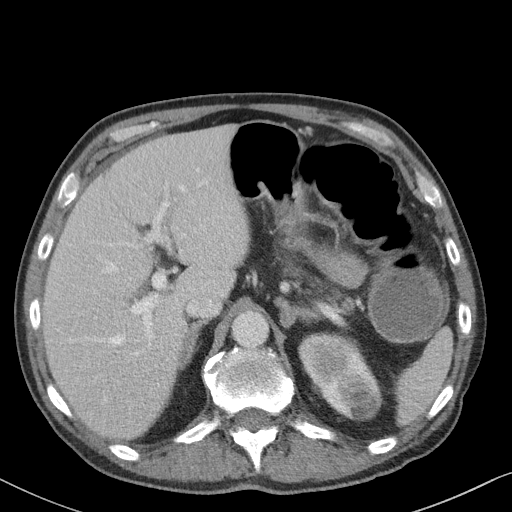
[im 81/97  soft-tissue]
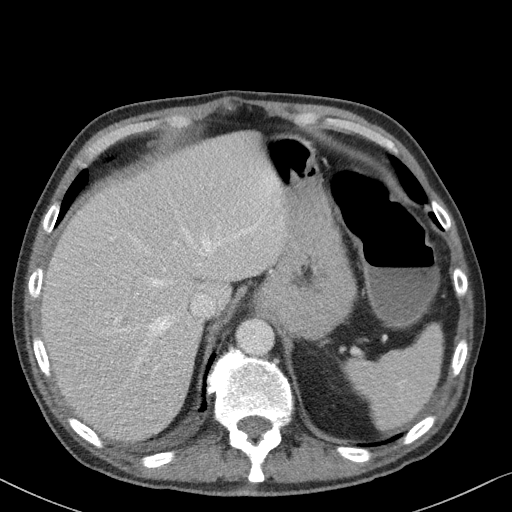
[im 91/97  soft-tissue]
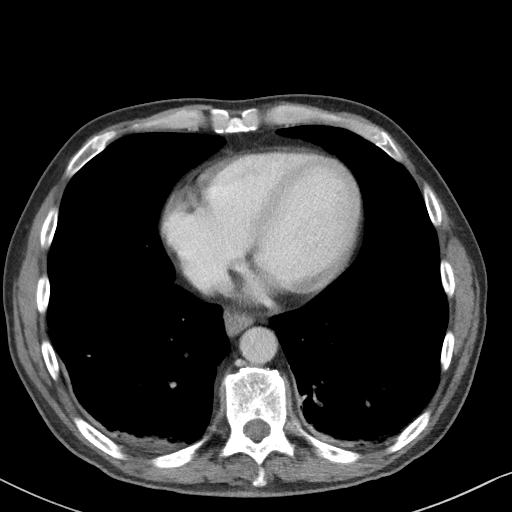

[Series 6: coronal st · coronal · 0.70mm/px · 3 of 103 slices shown]
[im 35/103  soft-tissue]
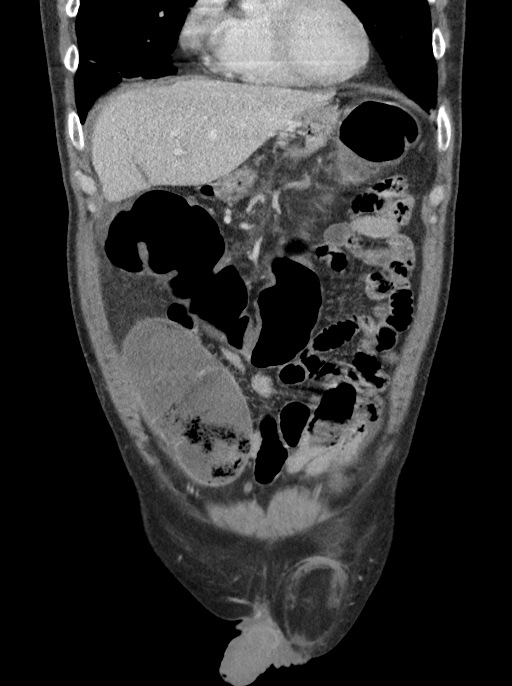
[im 46/103  soft-tissue]
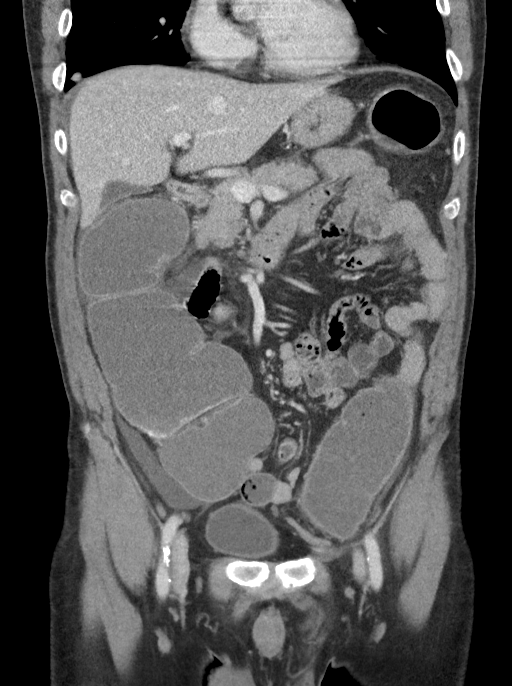
[im 57/103  soft-tissue]
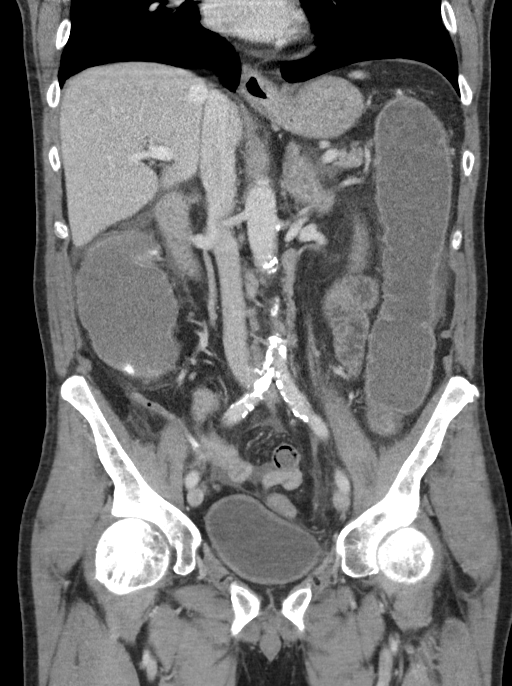

[15 of 46 positions shown; findings below may reference images not displayed]

FINDINGS: Lower chest: Lung bases are clear.

Hepatobiliary: No focal hepatic lesion. No biliary duct dilatation.
Gallbladder is normal. Common bile duct is normal.

Pancreas: Pancreas is normal. No ductal dilatation. No pancreatic
inflammation.

Spleen: Normal spleen

Adrenals/urinary tract: Nodular thickening of the LEFT adrenal
gland. The lateral limb of the LEFT adrenal gland measures 10 mm.
The medial limb measures 12 mm. RIGHT adrenal gland normal.

There is ill-defined rounded region of hypoperfusion in the upper
pole of the LEFT kidney measuring 2.4 cm (image [DATE]). Second lesion
in the posterior more superior aspect of the upper pole LEFT kidney
is similar measuring 2.2 cm (image [DATE]). Benign cyst in the LEFT
renal hilum.

There is a region of hypoperfusion in the upper pole of the RIGHT
kidney which less well-defined (image [DATE]. Potential region of
hypoperfusion in the lower pole of the RIGHT kidney

Stomach/Bowel: Stomach, duodenum and small bowel are normal. The
cecum is fluid-filled. Appendix is not identified. There is
pericolonic inflammation along the ascending colon. The transverse
colon is fluid-filled. The descending colon is distended and
fluid-filled. The descending colon enters LEFT inguinal hernia.
Proximal to the descending colon entering the hernia the diameter
measures 5.3 cm. Upon exiting the LEFT inguinal hernia, the bowel
diameter is 1.6 cm and collapsed. The more distal sigmoid colon and
rectum are near complete collapse. Findings are consistent with
high-grade mechanical obstruction of the descending colon upon
entering the LEFT inguinal hernia.

Vascular/Lymphatic: Abdominal aorta is normal caliber with
atherosclerotic calcification. There is no retroperitoneal or
periportal lymphadenopathy. No pelvic lymphadenopathy.

Reproductive: Prostate normal

Other: Small free fluid the pelvis.

Musculoskeletal: No aggressive osseous lesion.
IMPRESSION: 1. High-grade mechanical obstruction of the descending secondary to
entering and exiting a LEFT inguinal hernia. Obstruction occurs
within the hernia sac.
2. Fluid-filled colon from the level of LEFT inguinal obstruction
back through to the cecum. There is inflammation along the ascending
colon. No perforation or pneumatosis.
3. Free fluid in the pelvis related to obstruction.
4. There bilateral regions of hypoperfusion in LEFT and RIGHT
kidney. The regions in the LEFT kidney are more well-defined. Favor
multifocal bilateral pyelonephritis over LEFT renal neoplasm..
Recommend correlation with urinalysis and leukocytosis.
5. Nodular thickening of the LEFT adrenal gland is indeterminate.
Recommend adrenal protocol MRI at some point to evaluate these LEFT
adrenal lesions.

Findings conveyed Ismaun Mendung on 10/21/2018  at[DATE].

## 2023-02-21 ENCOUNTER — Encounter (HOSPITAL_COMMUNITY): Payer: Self-pay | Admitting: Internal Medicine

## 2023-04-27 ENCOUNTER — Other Ambulatory Visit (HOSPITAL_COMMUNITY): Payer: Self-pay | Admitting: Internal Medicine

## 2023-04-27 DIAGNOSIS — N184 Chronic kidney disease, stage 4 (severe): Secondary | ICD-10-CM

## 2023-04-27 DIAGNOSIS — Z87891 Personal history of nicotine dependence: Secondary | ICD-10-CM

## 2023-05-26 ENCOUNTER — Ambulatory Visit (HOSPITAL_COMMUNITY): Payer: 59

## 2023-05-26 ENCOUNTER — Encounter (HOSPITAL_COMMUNITY): Payer: Self-pay

## 2023-06-07 ENCOUNTER — Ambulatory Visit (HOSPITAL_COMMUNITY): Payer: 59

## 2023-07-01 ENCOUNTER — Ambulatory Visit (HOSPITAL_COMMUNITY): Admission: RE | Admit: 2023-07-01 | Payer: 59 | Source: Ambulatory Visit

## 2023-08-29 ENCOUNTER — Other Ambulatory Visit (HOSPITAL_COMMUNITY): Payer: Self-pay | Admitting: Internal Medicine

## 2023-08-29 DIAGNOSIS — Z136 Encounter for screening for cardiovascular disorders: Secondary | ICD-10-CM

## 2023-08-29 DIAGNOSIS — Z87891 Personal history of nicotine dependence: Secondary | ICD-10-CM

## 2023-09-06 ENCOUNTER — Ambulatory Visit (HOSPITAL_COMMUNITY): Payer: 59

## 2023-09-06 ENCOUNTER — Encounter (HOSPITAL_COMMUNITY): Payer: Self-pay

## 2023-09-28 ENCOUNTER — Ambulatory Visit (HOSPITAL_COMMUNITY)
Admission: RE | Admit: 2023-09-28 | Discharge: 2023-09-28 | Disposition: A | Discharge status: Home / Self Care | Payer: 59 | Source: Ambulatory Visit | Attending: Internal Medicine | Admitting: Internal Medicine

## 2023-09-28 DIAGNOSIS — Z87891 Personal history of nicotine dependence: Secondary | ICD-10-CM | POA: Insufficient documentation

## 2023-10-05 ENCOUNTER — Other Ambulatory Visit (HOSPITAL_COMMUNITY): Payer: Self-pay | Admitting: Nephrology

## 2023-10-05 DIAGNOSIS — R8290 Unspecified abnormal findings in urine: Secondary | ICD-10-CM

## 2023-10-05 DIAGNOSIS — N1832 Chronic kidney disease, stage 3b: Secondary | ICD-10-CM

## 2023-10-07 ENCOUNTER — Ambulatory Visit (HOSPITAL_COMMUNITY)
Admission: RE | Admit: 2023-10-07 | Discharge: 2023-10-07 | Disposition: A | Discharge status: Home / Self Care | Payer: 59 | Source: Ambulatory Visit | Attending: Nephrology | Admitting: Nephrology

## 2023-10-07 DIAGNOSIS — N1832 Chronic kidney disease, stage 3b: Secondary | ICD-10-CM | POA: Insufficient documentation

## 2023-10-07 DIAGNOSIS — R8290 Unspecified abnormal findings in urine: Secondary | ICD-10-CM | POA: Diagnosis present

## 2023-11-15 ENCOUNTER — Ambulatory Visit (HOSPITAL_COMMUNITY): Payer: 59 | Attending: Internal Medicine

## 2023-11-16 ENCOUNTER — Inpatient Hospital Stay: Payer: 59 | Attending: Oncology | Admitting: Oncology

## 2023-11-16 ENCOUNTER — Inpatient Hospital Stay: Payer: 59

## 2023-11-16 VITALS — BP 169/89 | HR 59 | Temp 97.2°F | Resp 18 | Ht 70.0 in | Wt 171.0 lb

## 2023-11-16 DIAGNOSIS — E1122 Type 2 diabetes mellitus with diabetic chronic kidney disease: Secondary | ICD-10-CM | POA: Diagnosis not present

## 2023-11-16 DIAGNOSIS — R778 Other specified abnormalities of plasma proteins: Secondary | ICD-10-CM | POA: Insufficient documentation

## 2023-11-16 DIAGNOSIS — Z720 Tobacco use: Secondary | ICD-10-CM

## 2023-11-16 DIAGNOSIS — N189 Chronic kidney disease, unspecified: Secondary | ICD-10-CM | POA: Insufficient documentation

## 2023-11-16 DIAGNOSIS — D801 Nonfamilial hypogammaglobulinemia: Secondary | ICD-10-CM

## 2023-11-16 DIAGNOSIS — F17210 Nicotine dependence, cigarettes, uncomplicated: Secondary | ICD-10-CM | POA: Insufficient documentation

## 2023-11-16 DIAGNOSIS — I129 Hypertensive chronic kidney disease with stage 1 through stage 4 chronic kidney disease, or unspecified chronic kidney disease: Secondary | ICD-10-CM | POA: Insufficient documentation

## 2023-11-16 DIAGNOSIS — R779 Abnormality of plasma protein, unspecified: Secondary | ICD-10-CM | POA: Insufficient documentation

## 2023-11-16 DIAGNOSIS — N1832 Chronic kidney disease, stage 3b: Secondary | ICD-10-CM

## 2023-11-16 LAB — CBC WITH DIFFERENTIAL/PLATELET
Abs Immature Granulocytes: 0.03 10*3/uL (ref 0.00–0.07)
Basophils Absolute: 0.1 10*3/uL (ref 0.0–0.1)
Basophils Relative: 1 %
Eosinophils Absolute: 0.2 10*3/uL (ref 0.0–0.5)
Eosinophils Relative: 2 %
HCT: 43.5 % (ref 39.0–52.0)
Hemoglobin: 14.5 g/dL (ref 13.0–17.0)
Immature Granulocytes: 0 %
Lymphocytes Relative: 19 %
Lymphs Abs: 1.8 10*3/uL (ref 0.7–4.0)
MCH: 29.5 pg (ref 26.0–34.0)
MCHC: 33.3 g/dL (ref 30.0–36.0)
MCV: 88.6 fL (ref 80.0–100.0)
Monocytes Absolute: 0.7 10*3/uL (ref 0.1–1.0)
Monocytes Relative: 7 %
Neutro Abs: 6.6 10*3/uL (ref 1.7–7.7)
Neutrophils Relative %: 71 %
Platelets: 280 10*3/uL (ref 150–400)
RBC: 4.91 MIL/uL (ref 4.22–5.81)
RDW: 13.2 % (ref 11.5–15.5)
WBC: 9.3 10*3/uL (ref 4.0–10.5)
nRBC: 0 % (ref 0.0–0.2)

## 2023-11-16 NOTE — Assessment & Plan Note (Signed)
Patient reports smoking approximately one pack of cigarettes per day. -Advise patient on the importance of smoking cessation.

## 2023-11-16 NOTE — Patient Instructions (Signed)
VISIT SUMMARY:  During today's visit, we discussed your recent blood work, which showed abnormal protein levels. We also reviewed your history of hypertension, diabetes, hyperlipidemia, chronic pain, and kidney disease. Additionally, we talked about your smoking habits and the importance of quitting smoking.  YOUR PLAN:  -ABNORMAL PROTEIN LEVELS: Your recent blood work showed low protein levels, which could be related to your kidney disease. We will order additional blood work to investigate further.  -CHRONIC KIDNEY DISEASE: Your kidney function is impaired, which may be affecting your protein levels and blood pressure. Please continue your follow-up with your nephrologist.  -HYPERTENSION: You have high blood pressure. It is important to discuss this with your primary care physician to see if your medication needs adjustment.  -TOBACCO USE: You smoke about one pack of cigarettes per day. Quitting smoking is very important for your overall health.   INSTRUCTIONS:  Please complete the additional blood work as soon as possible. We will review the results when you return from your trip. If the results indicate a serious condition, we will contact you immediately.

## 2023-11-16 NOTE — Assessment & Plan Note (Signed)
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  Follows with [employer] [facility].  Stable GFR. -Continue follow-up with nephrologist.

## 2023-11-16 NOTE — Progress Notes (Signed)
[facility] at [facility] HEMATOLOGY NEW VISIT  Salli Real, MD  REASON FOR REFERRAL: SPEP consistent with hypogammaglobulinemia  HISTORY OF PRESENT ILLNESS: Caleb Lynch 72 y.o. 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  He is accompanied by his wife today.  Patient has a past medical history of hypertension, diabetes, chronic back pain and chronic kidney disease.  He is of good health and has no complaints today.  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  Patient denies fatigue, nausea, vomiting, fever, chills, frequent infections, abdominal pain, diarrhea, rash.  His appetite is good and has not lost any weight.  He is very active around the house and also goes hunting.  Patient is an avid smoker, smokes 1 pack/day and has smoked even more before.  Does not drink alcohol.  Has no family history of cancer.  Lives in [place] with his wife   I have reviewed the past medical history, past surgical history, social history and family history with the patient   ALLERGIES:  has No Known Allergies.  MEDICATIONS:  Current Outpatient Medications  Medication Sig Dispense Refill   amLODipine (NORVASC) 10 MG tablet Take 1 tablet by mouth daily.  2   Cholecalciferol (VITAMIN D3) 1000 units CAPS Take by mouth.     cyclobenzaprine (FLEXERIL) 5 MG tablet Take 1 tablet (5 mg total) by mouth 3 (three) times daily as needed for muscle spasms. 15 tablet 0   dapagliflozin propanediol (FARXIGA) 5 MG TABS tablet Take by mouth.     glimepiride (AMARYL) 2 MG tablet Take 2 mg by mouth daily.     HYDROcodone-acetaminophen (NORCO/VICODIN) 5-325 MG per tablet Take one-two tabs po q 4-6 hrs prn pain 15 tablet 0   labetalol (NORMODYNE) 100 MG tablet Take by mouth.     lisinopril (PRINIVIL,ZESTRIL) 20 MG tablet Take 1 tablet by mouth daily.  5   Multiple Vitamin (MULTIVITAMIN  WITH MINERALS) TABS Take 1 tablet by mouth daily.     pantoprazole (PROTONIX) 40 MG tablet Take 40 mg by mouth daily.     pravastatin (PRAVACHOL) 40 MG tablet Take 40 mg by mouth daily.       No current facility-administered medications for this visit.     REVIEW OF SYSTEMS:   Constitutional: Denies fevers, chills or night sweats Eyes: Denies blurriness of vision Ears, nose, mouth, throat, and face: Denies mucositis or sore throat Respiratory: Denies cough, dyspnea or wheezes Cardiovascular: Denies palpitation, chest discomfort or lower extremity swelling Gastrointestinal:  Denies nausea, heartburn or change in bowel habits Skin: Denies abnormal skin rashes Lymphatics: Denies new lymphadenopathy or easy bruising Neurological:Denies numbness, tingling or new weaknesses Behavioral/Psych: Mood is stable, no new changes  All other systems were reviewed with the patient and are negative.  PHYSICAL EXAMINATION:   Vitals:   11/16/23 1054  BP: (!) 169/89  Pulse: (!) 59  Resp: 18  Temp: (!) 97.2 F (36.2 C)  SpO2: 100%    GENERAL:alert, no distress and comfortable LUNGS: clear to auscultation and percussion with normal breathing effort HEART: regular rate & rhythm and no murmurs and no lower extremity edema ABDOMEN:abdomen soft, non-tender and normal bowel sounds Musculoskeletal:no cyanosis of digits and no clubbing  NEURO: alert & oriented x 3 with fluent speech  LABORATORY DATA:  I have reviewed the data as listed labs from nephrology recently Labs from 10/21/2023 CBC: WBC: 8.9, hemoglobin: 14.1, hematocrit:  42.7, MCV: 98.6, platelets: 250 Protein creatinine ratio: 0.342 mg/g CMP: 1 creatinine: 1.90 GFR: 37, calcium: 9.8, albumin: 4.6 SPEP: No M spike but decreased, globulin percent: 0.6 No IFE available Free kappa light chain: 36.9, free lambda light chain: 25.1, free kappa lambda ratio: 1.47  Lab Results  Component Value Date   WBC 9.3 11/16/2023   NEUTROABS 6.6  11/16/2023   HGB 14.5 11/16/2023   HCT 43.5 11/16/2023   MCV 88.6 11/16/2023   PLT 280 11/16/2023      Component Value Date/Time   NA 140 10/24/2018 0424   K 3.2 (L) 10/24/2018 0424   CL 113 (H) 10/24/2018 0424   CO2 21 (L) 10/24/2018 0424   GLUCOSE 97 10/24/2018 0424   BUN 15 10/24/2018 0424   CREATININE 0.99 10/24/2018 0424   CALCIUM 8.0 (L) 10/24/2018 0424   PROT 6.4 (L) 10/21/2018 1127   ALBUMIN 3.1 (L) 10/21/2018 1127   AST 15 10/21/2018 1127   ALT 16 10/21/2018 1127   ALKPHOS 69 10/21/2018 1127   BILITOT 0.4 10/21/2018 1127   GFRNONAA >60 10/24/2018 0424   GFRAA >60 10/24/2018 0424      Chemistry      Component Value Date/Time   NA 140 10/24/2018 0424   K 3.2 (L) 10/24/2018 0424   CL 113 (H) 10/24/2018 0424   CO2 21 (L) 10/24/2018 0424   BUN 15 10/24/2018 0424   CREATININE 0.99 10/24/2018 0424      Component Value Date/Time   CALCIUM 8.0 (L) 10/24/2018 0424   ALKPHOS 69 10/21/2018 1127   AST 15 10/21/2018 1127   ALT 16 10/21/2018 1127   BILITOT 0.4 10/21/2018 1127       ASSESSMENT & PLAN:  Patient is a 72 year old male with multiple comorbidities referred for probable hypogammaglobinemia   Abnormality of plasma protein, unspecified Patient had abnormal SPEP with low globulin.  No M spike.  Patient has no history of frequent infections or any complaints today. -Repeat SPEP with IFE -Obtain immunoglobulin levels -Return to clinic in 1 month to discuss results  Chronic kidney disease Patient has a history of chronic kidney disease likely secondary to diabetes.  Follows with [employer].  Stable GFR. -Continue follow-up with nephrologist.  Tobacco use Patient reports smoking approximately one pack of cigarettes per day. -Advise patient on the importance of smoking cessation.   Orders Placed This Encounter  Procedures   CBC with Differential/Platelet    Standing Status:   Future    Number of Occurrences:   1    Standing Expiration Date:    11/15/2024   Kappa/lambda light chains    Standing Status:   Future    Number of Occurrences:   1    Standing Expiration Date:   11/15/2024   IgG, IgA, IgM    Standing Status:   Future    Number of Occurrences:   1    Standing Expiration Date:   11/15/2024   Multiple Myeloma Panel (SPEP&IFE w/QIG)    Standing Status:   Future    Number of Occurrences:   1    Standing Expiration Date:   11/15/2024    The total time spent in the appointment was 45 minutes encounter with patients including review of chart and various tests results, discussions about plan of care and coordination of care plan   All questions were answered. The patient knows to call the clinic with any problems, questions or concerns. No barriers to learning was detected.  Cindie Crumbly, MD 11/27/20243:50 PM

## 2023-11-16 NOTE — Assessment & Plan Note (Signed)
Patient had abnormal SPEP with low globulin.  No M spike.  Patient has no history of frequent infections or any complaints today. -Repeat SPEP with IFE -Obtain immunoglobulin levels -Return to clinic in 1 month to discuss results

## 2023-11-17 LAB — IGG, IGA, IGM
IgA: 119 mg/dL (ref 61–437)
IgG (Immunoglobin G), Serum: 677 mg/dL (ref 603–1613)
IgM (Immunoglobulin M), Srm: 19 mg/dL (ref 15–143)

## 2023-11-18 LAB — KAPPA/LAMBDA LIGHT CHAINS
Kappa free light chain: 32.2 mg/L — ABNORMAL HIGH (ref 3.3–19.4)
Kappa, lambda light chain ratio: 1.21 (ref 0.26–1.65)
Lambda free light chains: 26.6 mg/L — ABNORMAL HIGH (ref 5.7–26.3)

## 2023-11-23 LAB — MULTIPLE MYELOMA PANEL, SERUM
Albumin SerPl Elph-Mcnc: 4.2 g/dL (ref 2.9–4.4)
Albumin/Glob SerPl: 1.6 (ref 0.7–1.7)
Alpha 1: 0.3 g/dL (ref 0.0–0.4)
Alpha2 Glob SerPl Elph-Mcnc: 0.9 g/dL (ref 0.4–1.0)
B-Globulin SerPl Elph-Mcnc: 0.9 g/dL (ref 0.7–1.3)
Gamma Glob SerPl Elph-Mcnc: 0.6 g/dL (ref 0.4–1.8)
Globulin, Total: 2.7 g/dL (ref 2.2–3.9)
IgA: 117 mg/dL (ref 61–437)
IgG (Immunoglobin G), Serum: 677 mg/dL (ref 603–1613)
IgM (Immunoglobulin M), Srm: 20 mg/dL (ref 15–143)
Total Protein ELP: 6.9 g/dL (ref 6.0–8.5)

## 2023-12-22 ENCOUNTER — Ambulatory Visit: Payer: 59 | Admitting: Urology

## 2023-12-22 ENCOUNTER — Other Ambulatory Visit: Payer: Self-pay | Admitting: Urology

## 2023-12-22 ENCOUNTER — Ambulatory Visit (HOSPITAL_COMMUNITY): Payer: 59

## 2023-12-22 DIAGNOSIS — R109 Unspecified abdominal pain: Secondary | ICD-10-CM

## 2023-12-22 DIAGNOSIS — N1330 Unspecified hydronephrosis: Secondary | ICD-10-CM

## 2023-12-31 ENCOUNTER — Ambulatory Visit (HOSPITAL_COMMUNITY)
Admission: RE | Admit: 2023-12-31 | Discharge: 2023-12-31 | Disposition: A | Discharge status: Home / Self Care | Payer: 59 | Source: Ambulatory Visit | Attending: Urology | Admitting: Urology

## 2023-12-31 DIAGNOSIS — R109 Unspecified abdominal pain: Secondary | ICD-10-CM | POA: Diagnosis present

## 2023-12-31 DIAGNOSIS — N1330 Unspecified hydronephrosis: Secondary | ICD-10-CM | POA: Insufficient documentation

## 2024-01-04 ENCOUNTER — Inpatient Hospital Stay: Payer: 59 | Admitting: Oncology

## 2024-01-04 ENCOUNTER — Ambulatory Visit (INDEPENDENT_AMBULATORY_CARE_PROVIDER_SITE_OTHER): Payer: 59 | Admitting: Urology

## 2024-01-04 VITALS — BP 117/70 | HR 63

## 2024-01-04 DIAGNOSIS — N2889 Other specified disorders of kidney and ureter: Secondary | ICD-10-CM

## 2024-01-04 DIAGNOSIS — N1330 Unspecified hydronephrosis: Secondary | ICD-10-CM | POA: Diagnosis not present

## 2024-01-04 LAB — URINALYSIS, ROUTINE W REFLEX MICROSCOPIC
Bilirubin, UA: NEGATIVE
Ketones, UA: NEGATIVE
Leukocytes,UA: NEGATIVE
Nitrite, UA: NEGATIVE
Protein,UA: NEGATIVE
RBC, UA: NEGATIVE
Specific Gravity, UA: 1.015 (ref 1.005–1.030)
Urobilinogen, Ur: 0.2 mg/dL (ref 0.2–1.0)
pH, UA: 6 (ref 5.0–7.5)

## 2024-01-04 NOTE — Progress Notes (Signed)
01/04/2024 10:29 AM   Caleb Lynch 29-Feb-1951 [id number]  Referring provider: Salli Real, MD [address] [place],  [place] [address]  hydronephrosis   HPI: Caleb Lynch is a 72yo here for evaluation of left hydronephrosis/UPJ obstruction. 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 CT stone study was then obtained which showed UPJ obstruction versus renal cysts. He denies nay left flank pain. He denies any significant LUTS. No hematuria. No other complaints today   PMH: Past Medical History:  Diagnosis Date   Chronic back pain    Chronic hip pain    Diabetes mellitus    HTN (hypertension)    Hypercholesterolemia     Surgical History: Past Surgical History:  Procedure Laterality Date   INGUINAL HERNIA REPAIR Left 10/23/2018   Procedure: LEFT INGUINAL HERNIORRHAPY  WITH MESH;  Surgeon: Franky Macho, MD;  Location: [facility];  Service: General;  Laterality: Left;    Home Medications:  Allergies as of 01/04/2024   No Known Allergies      Medication List        Accurate as of January 04, 2024 10:29 AM. If you have any questions, ask your nurse or doctor.          amLODipine 10 MG tablet Commonly known as: NORVASC Take 1 tablet by mouth daily.   cyclobenzaprine 5 MG tablet Commonly known as: FLEXERIL Take 1 tablet (5 mg total) by mouth 3 (three) times daily as needed for muscle spasms.   dapagliflozin propanediol 5 MG Tabs tablet Commonly known as: FARXIGA Take by mouth.   glimepiride 2 MG tablet Commonly known as: AMARYL Take 2 mg by mouth daily.   HYDROcodone-acetaminophen 5-325 MG tablet Commonly known as: NORCO/VICODIN Take one-two tabs po q 4-6 hrs prn pain   labetalol 100 MG tablet Commonly known as: NORMODYNE Take by mouth.   lisinopril 20 MG tablet Commonly known as: ZESTRIL Take 1 tablet by mouth daily.   multivitamin with minerals Tabs tablet Take 1 tablet by mouth daily.    pantoprazole 40 MG tablet Commonly known as: PROTONIX Take 40 mg by mouth daily.   pravastatin 40 MG tablet Commonly known as: PRAVACHOL Take 40 mg by mouth daily.   Vitamin D3 1000 units Caps Take by mouth.        Allergies: No Known Allergies  Family History: Family History  Problem Relation Age of Onset   Arthritis Unknown    Diabetes Unknown     Social History:  reports that he has been smoking cigarettes. He has a 40 pack-year smoking history. He has never used smokeless tobacco. He reports that he does not drink alcohol and does not use drugs.  ROS: All other review of systems were reviewed and are negative except what is noted above in HPI  Physical Exam: BP 117/70   Pulse 63   Constitutional:  Alert and oriented, No acute distress. HEENT:  AT, moist mucus membranes.  Trachea midline, no masses. Cardiovascular: No clubbing, cyanosis, or edema. Respiratory: Normal respiratory effort, no increased work of breathing. GI: Abdomen is soft, nontender, nondistended, no abdominal masses GU: No CVA tenderness.  Lymph: No cervical or inguinal lymphadenopathy. Skin: No rashes, bruises or suspicious lesions. Neurologic: Grossly intact, no focal deficits, moving all 4 extremities. Psychiatric: Normal mood and affect.  Laboratory Data: Lab Results  Component Value Date   WBC 9.3 11/16/2023   HGB 14.5 11/16/2023   HCT 43.5 11/16/2023  MCV 88.6 11/16/2023   PLT 280 11/16/2023    Lab Results  Component Value Date   CREATININE 0.99 10/24/2018    No results found for: "PSA"  No results found for: "TESTOSTERONE"  No results found for: "HGBA1C"  Urinalysis    Component Value Date/Time   COLORURINE YELLOW 10/23/2018 2000   APPEARANCEUR CLEAR 10/23/2018 2000   LABSPEC 1.020 10/23/2018 2000   PHURINE 5.5 10/23/2018 2000   GLUCOSEU NEGATIVE 10/23/2018 2000   HGBUR TRACE (A) 10/23/2018 2000   BILIRUBINUR NEGATIVE 10/23/2018 2000   KETONESUR TRACE (A)  10/23/2018 2000   PROTEINUR TRACE (A) 10/23/2018 2000   NITRITE NEGATIVE 10/23/2018 2000   LEUKOCYTESUR NEGATIVE 10/23/2018 2000    Lab Results  Component Value Date   BACTERIA RARE (A) 10/23/2018    Pertinent Imaging: CT 12/31/2023: Images reviewed and discussed with the patient  No results found for this or any previous visit.  No results found for this or any previous visit.  No results found for this or any previous visit.  No results found for this or any previous visit.  Results for orders placed during the hospital encounter of 10/07/23  US RENAL  Narrative CLINICAL DATA:  Stage IIIB chronic kidney disease.  EXAM: RENAL / URINARY TRACT ULTRASOUND COMPLETE  COMPARISON:  CT abdomen pelvis October 21, 2018  FINDINGS: Right Kidney:  Renal measurements: 10.1 x 5.3 x 6.2 cm = volume: 1752. mL. Echogenicity within normal limits. No hydronephrosis visualized. Simple cysts are noted in the right kidney, largest measures 0.6 x 0.8 x 0.8 cm. No follow-up is recommended.  Left Kidney:  Renal measurements: 9.6 x 6 x 5.2 cm = volume: 154.54 mL. Echogenicity within normal limits. No mass visualized. Moderate left hydronephrosis.  Bladder:  Appears normal for degree of bladder distention. Right ureteral jet is noted. Left ureteral jet is not seen.  Other:  None.  IMPRESSION: 1. Moderate left hydronephrosis. Left ureteral jet is not seen.   Electronically Signed By: Sherian Rein M.D. On: 10/07/2023 15:33  No results found for this or any previous visit.  No results found for this or any previous visit.  Results for orders placed in visit on 12/22/23  CT RENAL STONE STUDY  Narrative CLINICAL DATA:  Abdominal/flank pain  EXAM: CT ABDOMEN AND PELVIS WITHOUT CONTRAST  TECHNIQUE: Multidetector CT imaging of the abdomen and pelvis was performed following the standard protocol without IV contrast.  RADIATION DOSE REDUCTION: This exam was performed  according to the departmental dose-optimization program which includes automated exposure control, adjustment of the mA and/or kV according to patient size and/or use of iterative reconstruction technique.  COMPARISON:  Renal ultrasound 10/07/2023  FINDINGS: Lower chest: Linear scarring or subsegmental atelectasis in the posterior basal segments of both lower lobes.  5 mm right middle lobe and lingular nodules are unchanged from 09/28/2023, follow up recommendations from that exam remain in effect.  Airway thickening is present, suggesting bronchitis or reactive airways disease.  Descending thoracic aortic and right coronary artery atheromatous vascular calcifications.  Hepatobiliary: Unremarkable  Pancreas: Unremarkable  Spleen: Unremarkable  Adrenals/Urinary Tract: 1.2 by 1.9 cm mass of the medial limb left adrenal gland image 25 series 2, internal density 3 Hounsfield units, compatible with benign adenoma.  1.9 by 1.3 cm mass of the lateral limb left adrenal gland with internal density 0 Hounsfield units compatible with benign adenoma. No further imaging workup of these lesions is indicated.  A parapelvic cyst along the left renal sinus has  enlarged and is currently 4 cm in long axis on image 58 series 5. This cyst appears simple.  There is moderate left hydronephrosis extending to the left UPJ, suspicious for UPJ stricture or obstruction. No stone identified. No hydroureter.  Suspected mild scarring and portions of the left kidney. Lobular contour the kidneys favoring retained fetal lobulation. Chronic 5 mm stone anteriorly in the right kidney lower pole.  Stomach/Bowel: Sigmoid colon diverticulosis.  Vascular/Lymphatic: Atherosclerosis is present, including aortoiliac atherosclerotic disease.  Reproductive: Unremarkable  Other: No supplemental non-categorized findings.  Musculoskeletal: Moderate degenerative chondral thinning in both hips. Lumbar  spondylosis and degenerative disc disease with bilateral foraminal stenosis at L4-5 at L5-S1.  IMPRESSION: 1. Moderate left hydronephrosis extending to the left UPJ, suspicious for UPJ stricture or obstruction. No left stone identified. A chronic simple left parapelvic cyst is present along the margin of the renal pelvis, this has enlarged from 2019, questionable contribution to the UPJ narrowing. 2. Chronic 5 mm stone anteriorly in the right kidney lower pole. 3. Sigmoid colon diverticulosis. 4. Lumbar spondylosis and degenerative disc disease with bilateral foraminal stenosis at L4-5 and L5-S1. 5. Airway thickening is present, suggesting bronchitis or reactive airways disease. 6. 5 mm right middle lobe and lingular nodules are unchanged from 09/28/2023, follow up recommendations from that exam remain in effect. 7. Aortic and coronary artery atherosclerosis.  Aortic Atherosclerosis (ICD10-I70.0).   Electronically Signed By: Gaylyn Rong M.D. On: 12/31/2023 15:07   Assessment & Plan:    1. Hydronephrosis, unspecified hydronephrosis type (Primary) We will obtain MRI abd w/wo contrast to further characterize the renal cysts, UPJ obstruction - Urinalysis, Routine w reflex microscopic  2. Other specified disorders of kidney and ureter MRI abd w/wo contrast   No follow-ups on file.  Wilkie Aye, MD  [facility]

## 2024-01-10 ENCOUNTER — Encounter: Payer: Self-pay | Admitting: Urology

## 2024-01-10 NOTE — Patient Instructions (Signed)
Hydronephrosis  Hydronephrosis is the swelling of one or both kidneys due to a blockage that stops urine from flowing out of the body. Kidneys filter waste from the blood and produce urine. This condition can lead to kidney failure and may become life-threatening if not treated promptly. What are the causes? In infants and children, common causes include problems that occur when a baby is developing in the womb. These can include problems in the kidneys or in the tubes that drain urine into the bladder (ureters). In adults, common causes include: Kidney stones. Pregnancy. A tumor or cyst in the abdomen or pelvis. An enlarged prostate gland. Other causes include: Bladder infection. Scar tissue from a previous surgery or injury. A blood clot. Cancer of the prostate, bladder, uterus, ovary, or colon. What are the signs or symptoms? Symptoms of this condition include: Pain or discomfort in your side (flank) or abdomen. Swelling in your abdomen. Nausea and vomiting. Fever. Pain when passing urine. Feelings of urgency when you need to urinate. Urinating more often than normal. In some cases, you may not have any symptoms. How is this diagnosed? This condition may be diagnosed based on: Your symptoms and medical history. A physical exam. Blood and urine tests. Imaging tests, such as an ultrasound, CT scan, or MRI. A procedure to look at your urinary tract and bladder by inserting a scope into the urethra (cystoscopy). How is this treated? Treatment for this condition depends on where the blockage is, how long it has been there, and what caused it. The goal of treatment is to remove the blockage. Treatment may include: Antibiotic medicines to treat or prevent infection. A procedure to place a small, thin tube (stent) into a blocked ureter. The stent will keep the ureter open so that urine can drain through it. A nonsurgical procedure that crushes kidney stones with shock waves  (extracorporeal shock wave lithotripsy). If kidney failure occurs, treatment may include dialysis or a kidney transplant. Follow these instructions at home:  Take over-the-counter and prescription medicines only as told by your health care provider. If you were prescribed an antibiotic medicine, take it exactly as told by your health care provider. Do not stop taking the antibiotic even if you start to feel better. Rest and return to your normal activities as told by your health care provider. Ask your health care provider what activities are safe for you. Drink enough fluid to keep your urine pale yellow. Keep all follow-up visits. This is important. Contact a health care provider if: You continue to have symptoms after treatment. You develop new symptoms. Your urine becomes cloudy or bloody. You have a fever. Get help right away if: You have severe flank or abdominal pain. You cannot drink fluids without vomiting. Summary Hydronephrosis is the swelling of one or both kidneys due to a blockage that stops urine from flowing out of the body. Hydronephrosis can lead to kidney failure and may become life-threatening if not treated promptly. The goal of treatment is to remove the blockage. It may include a procedure to insert a stent into a blocked ureter, a procedure to break up kidney stones, or taking antibiotic medicines. Follow your health care provider's instructions for taking care of yourself at home, including instructions about drinking fluids, taking medicines, and limiting activities. This information is not intended to replace advice given to you by your health care provider. Make sure you discuss any questions you have with your health care provider. Document Revised: 09/06/2023 Document Reviewed: 09/06/2023 Elsevier  Patient Education  2024 [employer].

## 2024-01-11 ENCOUNTER — Inpatient Hospital Stay: Payer: 59 | Admitting: Oncology

## 2024-01-24 ENCOUNTER — Telehealth: Payer: Self-pay

## 2024-01-24 NOTE — Telephone Encounter (Signed)
Called Pt to relay message from MD "CT shows left UPJ obstrcution and patient is asymptomatic" explained what UPJ and asymptomatic was to Pt wife and wife asked was that good I told her id f/u with MD

## 2024-01-24 NOTE — Telephone Encounter (Signed)
-----   Message from Belvie Clara sent at 01/24/2024  8:22 AM EST ----- CT shows left UPJ obstrcution and patient is asymptomatic ----- Message ----- From: Sammie Exie HERO, CMA Sent: 01/02/2024   8:01 AM EST To: Belvie LITTIE Clara, MD  Please review. Appt 01/15

## 2024-01-25 ENCOUNTER — Inpatient Hospital Stay: Payer: 59 | Attending: Oncology | Admitting: Oncology

## 2024-01-26 ENCOUNTER — Inpatient Hospital Stay: Payer: 59 | Attending: Oncology | Admitting: Oncology

## 2024-01-26 VITALS — BP 158/97 | HR 54 | Temp 96.5°F | Resp 20 | Wt 173.1 lb

## 2024-01-26 DIAGNOSIS — N189 Chronic kidney disease, unspecified: Secondary | ICD-10-CM | POA: Insufficient documentation

## 2024-01-26 DIAGNOSIS — Z720 Tobacco use: Secondary | ICD-10-CM

## 2024-01-26 DIAGNOSIS — F17200 Nicotine dependence, unspecified, uncomplicated: Secondary | ICD-10-CM | POA: Diagnosis not present

## 2024-01-26 DIAGNOSIS — R779 Abnormality of plasma protein, unspecified: Secondary | ICD-10-CM

## 2024-01-26 DIAGNOSIS — D801 Nonfamilial hypogammaglobulinemia: Secondary | ICD-10-CM | POA: Insufficient documentation

## 2024-01-26 DIAGNOSIS — N1832 Chronic kidney disease, stage 3b: Secondary | ICD-10-CM

## 2024-01-26 NOTE — Assessment & Plan Note (Signed)
 Patient admits to smoking prior to the visit. Discussed the importance of smoking cessation. -Strongly advised to quit smoking.

## 2024-01-26 NOTE — Patient Instructions (Signed)
 VISIT SUMMARY:  You came in for a follow-up visit to check on your protein levels, which were initially low but have since returned to normal. You did not report any new symptoms or complaints. We also discussed your ongoing kidney disease and smoking habits.  YOUR PLAN:  -CHRONIC KIDNEY DISEASE: Chronic Kidney Disease means your kidneys are damaged and can't filter blood as well as they should. Your protein levels are now normal, but you should continue to follow up with your nephrologist as needed.  -TOBACCO USE: Smoking can harm nearly every organ in your body and is especially harmful if you have kidney disease. It is very important that you quit smoking. Please consider using resources like counseling or medications to help you stop.  -GENERAL HEALTH MAINTENANCE: All other routine lab tests, including anemia screening, were normal. Continue with your current health management and routine check-ups.  INSTRUCTIONS:  Please continue to follow up with your nephrologist as needed. It is very important that you quit smoking; consider using resources like counseling or medications to help you stop.

## 2024-01-26 NOTE — Assessment & Plan Note (Signed)
 Repeat labs showed slightly elevated free light chains with normal FLC ratio consistent with chronic kidney disease.  Normal SPEP with no M spike.  Normal immunoglobulin levels. -Continue to monitor free light chains with nephrologist -No further workup or follow-up needed with hematology

## 2024-01-26 NOTE — Progress Notes (Signed)
 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  Normal SPEP with no M spike.  Normal immunoglobulin levels. -Continue to monitor free light chains with nephrologist -No further workup or follow-up needed with hematology  Chronic kidney disease Patient has a history of chronic kidney disease likely secondary to diabetes.  Follows with [employer].  Stable GFR. -Continue follow-up with nephrologist.  Tobacco use Patient admits to smoking prior to the visit. Discussed the importance of smoking cessation. -Strongly advised to quit smoking.    The total time spent in the appointment was 15 minutes encounter with patients including review of chart and various tests results, discussions about plan of care and coordination of care plan   All questions were answered. The patient knows to call the clinic with any problems, questions or concerns. No barriers to learning was detected.  Caleb Dry, MD 2/6/202510:11 AM   INTERVAL HISTORY: Caleb Lynch 73 y.o. male with chronic kidney disease is here for follow-up for low globulin on SPEP.  He is accompanied by his wife today.  The patient does not report any new symptoms or complaints. He has a known history of kidney disease and is under the care of a nephrologist. The patient also admits to smoking, despite previous discussions about the need to quit.  He denies weight loss, fever, chills, nausea, abdominal pain, bone pain, fatigue.   I have reviewed the past medical history, past surgical history, social history and family history with the patient    ALLERGIES:  has no known allergies.  MEDICATIONS:  Current Outpatient Medications  Medication Sig Dispense Refill   amLODipine  (NORVASC ) 10 MG tablet Take 1 tablet by mouth daily.  2   Cholecalciferol (VITAMIN D3) 1000 units CAPS Take by mouth.     cyclobenzaprine  (FLEXERIL ) 5 MG tablet Take 1 tablet (5 mg total) by mouth 3 (three) times daily as needed for muscle spasms. 15 tablet 0   dapagliflozin propanediol (FARXIGA) 5 MG TABS tablet Take by mouth.     glimepiride (AMARYL) 2 MG tablet Take 2 mg by mouth daily.     HYDROcodone -acetaminophen  (NORCO/VICODIN) 5-325 MG per tablet Take one-two tabs po q 4-6 hrs prn pain 15 tablet 0   labetalol (NORMODYNE) 100 MG tablet Take by mouth.     lisinopril  (PRINIVIL ,ZESTRIL ) 20 MG tablet Take 1 tablet by mouth daily.  5   Multiple Vitamin (MULTIVITAMIN WITH MINERALS) TABS Take 1 tablet by mouth daily.     pantoprazole (PROTONIX) 40 MG tablet Take 40 mg by mouth daily.     pravastatin (PRAVACHOL) 40 MG tablet Take 40 mg by mouth daily.       No current facility-administered medications for this visit.     REVIEW OF SYSTEMS:   Constitutional: Denies fevers, chills or night sweats Eyes: Denies blurriness of vision Ears, nose, mouth, throat, and face: Denies mucositis or sore throat Respiratory: Denies cough, dyspnea or wheezes Cardiovascular: Denies palpitation, chest discomfort or lower extremity swelling Gastrointestinal:  Denies nausea, heartburn or change in bowel habits Skin: Denies abnormal skin rashes Lymphatics: Denies new lymphadenopathy or easy bruising Neurological:Denies numbness, tingling or new weaknesses Behavioral/Psych: Mood is stable, no new changes  All other systems were reviewed with the patient and are negative.  PHYSICAL EXAMINATION:   Vitals:   01/26/24 0837 01/26/24 0838  BP: (!) 173/88 (!) 158/97  Pulse: (!) 54   Resp: 20   Temp: (!) 96.5 F (35.8 C)   SpO2: 100%     GENERAL:alert, no distress and  comfortable LUNGS: clear to auscultation and percussion with normal breathing effort HEART: regular rate & rhythm and no murmurs and no lower extremity edema ABDOMEN:abdomen soft, non-tender and normal bowel sounds Musculoskeletal:no cyanosis of digits and no clubbing  NEURO: alert & oriented x 3 with fluent speech  LABORATORY DATA:  I have reviewed the data as listed  Latest Reference Range & Units 11/16/23 11:47 11/16/23 11:48  Total Protein ELP 6.0 - 8.5 g/dL 6.9 (C)   Albumin SerPl Elph-Mcnc 2.9 - 4.4 g/dL 4.2 (C)   Albumin/Glob SerPl 0.7 - 1.7  1.6 (C)   Alpha2 Glob SerPl Elph-Mcnc 0.4 - 1.0 g/dL 0.9 (C)   Alpha 1 0.0 - 0.4 g/dL 0.3 (C)   Gamma Glob SerPl Elph-Mcnc 0.4 - 1.8 g/dL 0.6 (C)   M Protein SerPl Elph-Mcnc Not Observed g/dL Not Observed (C)   IFE 1  Comment (C)   Globulin, Total 2.2 - 3.9 g/dL 2.7 (C)   B-Globulin SerPl Elph-Mcnc 0.7 - 1.3 g/dL 0.9 (C)   IgG (Immunoglobin G), Serum 603 - 1,613 mg/dL 322 322  IgM (Immunoglobulin M), Srm 15 - 143 mg/dL 20 19  IgA 61 - 562 mg/dL 882 880  WBC 4.0 - 89.4 K/uL  9.3  RBC 4.22 - 5.81 MIL/uL  4.91  Hemoglobin 13.0 - 17.0 g/dL  85.4  HCT 60.9 - 47.9 %  43.5  MCV 80.0 - 100.0 fL  88.6  MCH 26.0 - 34.0 pg  29.5  MCHC 30.0 - 36.0 g/dL  66.6  RDW 88.4 - 84.4 %  13.2  Platelets 150 - 400 K/uL  280  nRBC 0.0 - 0.2 %  0.0  Neutrophils %  71  Lymphocytes %  19  Monocytes Relative %  7  Eosinophil %  2  Basophil %  1  Immature Granulocytes %  0  NEUT# 1.7 - 7.7 K/uL  6.6  Lymphs Abs 0.7 - 4.0 K/uL  1.8  Monocyte # 0.1 - 1.0 K/uL  0.7  Eosinophils Absolute 0.0 - 0.5 K/uL  0.2  Basophils Absolute 0.0 - 0.1 K/uL  0.1  Abs Immature Granulocytes 0.00 - 0.07 K/uL  0.03  (H): Data is abnormally high (L): Data is abnormally low (C): Corrected      Chemistry      Component Value Date/Time   NA 140 10/24/2018 0424   K 3.2 (L) 10/24/2018 0424   CL 113 (H) 10/24/2018 0424   CO2 21 (L) 10/24/2018 0424   BUN 15 10/24/2018 0424    CREATININE 0.99 10/24/2018 0424      Component Value Date/Time   CALCIUM 8.0 (L) 10/24/2018 0424   ALKPHOS 69 10/21/2018 1127   AST 15 10/21/2018 1127   ALT 16 10/21/2018 1127   BILITOT 0.4 10/21/2018 1127

## 2024-01-26 NOTE — Assessment & Plan Note (Signed)
 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  Follows with [employer].  Stable GFR. -Continue follow-up with nephrologist.

## 2024-01-27 ENCOUNTER — Other Ambulatory Visit (HOSPITAL_COMMUNITY): Payer: 59

## 2024-02-09 ENCOUNTER — Ambulatory Visit: Payer: 59 | Admitting: Urology

## 2024-02-15 ENCOUNTER — Ambulatory Visit (HOSPITAL_COMMUNITY)
Admission: RE | Admit: 2024-02-15 | Discharge: 2024-02-15 | Disposition: A | Discharge status: Home / Self Care | Payer: 59 | Source: Ambulatory Visit | Attending: Urology | Admitting: Urology

## 2024-02-15 DIAGNOSIS — N2889 Other specified disorders of kidney and ureter: Secondary | ICD-10-CM | POA: Diagnosis present

## 2024-02-15 MED ORDER — GADOBUTROL 1 MMOL/ML IV SOLN
8.0000 mL | Freq: Once | INTRAVENOUS | Status: AC | PRN
  Administered 2024-02-15: 8 mL via INTRAVENOUS

## 2024-02-27 ENCOUNTER — Ambulatory Visit (INDEPENDENT_AMBULATORY_CARE_PROVIDER_SITE_OTHER): Payer: 59 | Admitting: Urology

## 2024-02-27 VITALS — BP 98/60 | HR 66

## 2024-02-27 DIAGNOSIS — N1330 Unspecified hydronephrosis: Secondary | ICD-10-CM

## 2024-02-27 NOTE — Progress Notes (Unsigned)
 02/27/2024 10:49 AM   [name] 02/05/1951 [id number]  Referring provider: Salli Real, MD [address] [place],  [place] [address]  No chief complaint on file.   HPI:    PMH: Past Medical History:  Diagnosis Date   Chronic back pain    Chronic hip pain    Diabetes mellitus    HTN (hypertension)    Hypercholesterolemia     Surgical History: Past Surgical History:  Procedure Laterality Date   INGUINAL HERNIA REPAIR Left 10/23/2018   Procedure: LEFT INGUINAL HERNIORRHAPY  WITH MESH;  Surgeon: Franky Macho, MD;  Location: [facility];  Service: General;  Laterality: Left;    Home Medications:  Allergies as of 02/27/2024   No Known Allergies      Medication List        Accurate as of February 27, 2024 10:49 AM. If you have any questions, ask your nurse or doctor.          amLODipine 10 MG tablet Commonly known as: NORVASC Take 1 tablet by mouth daily.   cyclobenzaprine 5 MG tablet Commonly known as: FLEXERIL Take 1 tablet (5 mg total) by mouth 3 (three) times daily as needed for muscle spasms.   dapagliflozin propanediol 5 MG Tabs tablet Commonly known as: FARXIGA Take by mouth.   glimepiride 2 MG tablet Commonly known as: AMARYL Take 2 mg by mouth daily.   HYDROcodone-acetaminophen 5-325 MG tablet Commonly known as: NORCO/VICODIN Take one-two tabs po q 4-6 hrs prn pain   labetalol 100 MG tablet Commonly known as: NORMODYNE Take by mouth.   lisinopril 20 MG tablet Commonly known as: ZESTRIL Take 1 tablet by mouth daily.   multivitamin with minerals Tabs tablet Take 1 tablet by mouth daily.   pantoprazole 40 MG tablet Commonly known as: PROTONIX Take 40 mg by mouth daily.   pravastatin 40 MG tablet Commonly known as: PRAVACHOL Take 40 mg by mouth daily.   Vitamin D3 1000 units Caps Take by mouth.        Allergies: No Known Allergies  Family History: Family History  Problem Relation Age of Onset   Arthritis Unknown     Diabetes Unknown     Social History:  reports that he has been smoking cigarettes. He has a 40 pack-year smoking history. He has never used smokeless tobacco. He reports that he does not drink alcohol and does not use drugs.  ROS: All other review of systems were reviewed and are negative except what is noted above in HPI  Physical Exam: BP 98/60   Pulse 66   Constitutional:  Alert and oriented, No acute distress. HEENT: [place] AT, moist mucus membranes.  Trachea midline, no masses. Cardiovascular: No clubbing, cyanosis, or edema. Respiratory: Normal respiratory effort, no increased work of breathing. GI: Abdomen is soft, nontender, nondistended, no abdominal masses GU: No CVA tenderness.  Lymph: No cervical or inguinal lymphadenopathy. Skin: No rashes, bruises or suspicious lesions. Neurologic: Grossly intact, no focal deficits, moving all 4 extremities. Psychiatric: Normal mood and affect.  Laboratory Data: Lab Results  Component Value Date   WBC 9.3 11/16/2023   HGB 14.5 11/16/2023   HCT 43.5 11/16/2023   MCV 88.6 11/16/2023   PLT 280 11/16/2023    Lab Results  Component Value Date   CREATININE 0.99 10/24/2018    No results found for: "PSA"  No results found for: "TESTOSTERONE"  No results found for: "HGBA1C"  Urinalysis    Component Value Date/Time   COLORURINE YELLOW  10/23/2018 2000   APPEARANCEUR Clear 01/04/2024 0955   LABSPEC 1.020 10/23/2018 2000   PHURINE 5.5 10/23/2018 2000   GLUCOSEU 1+ (A) 01/04/2024 0955   HGBUR TRACE (A) 10/23/2018 2000   BILIRUBINUR Negative 01/04/2024 0955   KETONESUR TRACE (A) 10/23/2018 2000   PROTEINUR Negative 01/04/2024 0955   PROTEINUR TRACE (A) 10/23/2018 2000   NITRITE Negative 01/04/2024 0955   NITRITE NEGATIVE 10/23/2018 2000   LEUKOCYTESUR Negative 01/04/2024 0955    Lab Results  Component Value Date   LABMICR Comment 01/04/2024   BACTERIA RARE (A) 10/23/2018    Pertinent Imaging: *** No results found for  this or any previous visit.  No results found for this or any previous visit.  No results found for this or any previous visit.  No results found for this or any previous visit.  Results for orders placed during the hospital encounter of 10/07/23  US RENAL  Narrative CLINICAL DATA:  Stage IIIB chronic kidney disease.  EXAM: RENAL / URINARY TRACT ULTRASOUND COMPLETE  COMPARISON:  CT abdomen pelvis October 21, 2018  FINDINGS: Right Kidney:  Renal measurements: 10.1 x 5.3 x 6.2 cm = volume: 1752. mL. Echogenicity within normal limits. No hydronephrosis visualized. Simple cysts are noted in the right kidney, largest measures 0.6 x 0.8 x 0.8 cm. No follow-up is recommended.  Left Kidney:  Renal measurements: 9.6 x 6 x 5.2 cm = volume: 154.54 mL. Echogenicity within normal limits. No mass visualized. Moderate left hydronephrosis.  Bladder:  Appears normal for degree of bladder distention. Right ureteral jet is noted. Left ureteral jet is not seen.  Other:  None.  IMPRESSION: 1. Moderate left hydronephrosis. Left ureteral jet is not seen.   Electronically Signed By: Sherian Rein M.D. On: 10/07/2023 15:33  No results found for this or any previous visit.  No results found for this or any previous visit.  Results for orders placed in visit on 12/22/23  CT RENAL STONE STUDY  Narrative CLINICAL DATA:  Abdominal/flank pain  EXAM: CT ABDOMEN AND PELVIS WITHOUT CONTRAST  TECHNIQUE: Multidetector CT imaging of the abdomen and pelvis was performed following the standard protocol without IV contrast.  RADIATION DOSE REDUCTION: This exam was performed according to the departmental dose-optimization program which includes automated exposure control, adjustment of the mA and/or kV according to patient size and/or use of iterative reconstruction technique.  COMPARISON:  Renal ultrasound 10/07/2023  FINDINGS: Lower chest: Linear scarring or subsegmental  atelectasis in the posterior basal segments of both lower lobes.  5 mm right middle lobe and lingular nodules are unchanged from 09/28/2023, follow up recommendations from that exam remain in effect.  Airway thickening is present, suggesting bronchitis or reactive airways disease.  Descending thoracic aortic and right coronary artery atheromatous vascular calcifications.  Hepatobiliary: Unremarkable  Pancreas: Unremarkable  Spleen: Unremarkable  Adrenals/Urinary Tract: 1.2 by 1.9 cm mass of the medial limb left adrenal gland image 25 series 2, internal density 3 Hounsfield units, compatible with benign adenoma.  1.9 by 1.3 cm mass of the lateral limb left adrenal gland with internal density 0 Hounsfield units compatible with benign adenoma. No further imaging workup of these lesions is indicated.  A parapelvic cyst along the left renal sinus has enlarged and is currently 4 cm in long axis on image 58 series 5. This cyst appears simple.  There is moderate left hydronephrosis extending to the left UPJ, suspicious for UPJ stricture or obstruction. No stone identified. No hydroureter.  Suspected mild scarring and  portions of the left kidney. Lobular contour the kidneys favoring retained fetal lobulation. Chronic 5 mm stone anteriorly in the right kidney lower pole.  Stomach/Bowel: Sigmoid colon diverticulosis.  Vascular/Lymphatic: Atherosclerosis is present, including aortoiliac atherosclerotic disease.  Reproductive: Unremarkable  Other: No supplemental non-categorized findings.  Musculoskeletal: Moderate degenerative chondral thinning in both hips. Lumbar spondylosis and degenerative disc disease with bilateral foraminal stenosis at L4-5 at L5-S1.  IMPRESSION: 1. Moderate left hydronephrosis extending to the left UPJ, suspicious for UPJ stricture or obstruction. No left stone identified. A chronic simple left parapelvic cyst is present along the margin of the  renal pelvis, this has enlarged from 2019, questionable contribution to the UPJ narrowing. 2. Chronic 5 mm stone anteriorly in the right kidney lower pole. 3. Sigmoid colon diverticulosis. 4. Lumbar spondylosis and degenerative disc disease with bilateral foraminal stenosis at L4-5 and L5-S1. 5. Airway thickening is present, suggesting bronchitis or reactive airways disease. 6. 5 mm right middle lobe and lingular nodules are unchanged from 09/28/2023, follow up recommendations from that exam remain in effect. 7. Aortic and coronary artery atherosclerosis.  Aortic Atherosclerosis (ICD10-I70.0).   Electronically Signed By: Gaylyn Rong M.D. On: 12/31/2023 15:07   Assessment & Plan:    1. Hydronephrosis, unspecified hydronephrosis type (Primary) MRI shows parapelvic cysts and not hydronephrosis. Patient to come back prn  - Urinalysis, Routine w reflex microscopic   No follow-ups on file.  Wilkie Aye, MD  [facility] [place]

## 2024-02-28 ENCOUNTER — Encounter: Payer: Self-pay | Admitting: Urology

## 2024-02-28 LAB — URINALYSIS, ROUTINE W REFLEX MICROSCOPIC
Bilirubin, UA: NEGATIVE
Ketones, UA: NEGATIVE
Nitrite, UA: NEGATIVE
Protein,UA: NEGATIVE
RBC, UA: NEGATIVE
Specific Gravity, UA: 1.01 (ref 1.005–1.030)
Urobilinogen, Ur: 0.2 mg/dL (ref 0.2–1.0)
pH, UA: 6 (ref 5.0–7.5)

## 2024-02-28 LAB — MICROSCOPIC EXAMINATION

## 2024-05-25 ENCOUNTER — Other Ambulatory Visit (HOSPITAL_COMMUNITY): Payer: Self-pay | Admitting: Internal Medicine

## 2024-05-25 DIAGNOSIS — Z122 Encounter for screening for malignant neoplasm of respiratory organs: Secondary | ICD-10-CM

## 2024-06-10 ENCOUNTER — Ambulatory Visit (HOSPITAL_COMMUNITY)
Admission: RE | Admit: 2024-06-10 | Discharge: 2024-06-10 | Disposition: A | Discharge status: Home / Self Care | Source: Ambulatory Visit | Attending: Internal Medicine | Admitting: Internal Medicine

## 2024-06-10 DIAGNOSIS — Z131 Encounter for screening for diabetes mellitus: Secondary | ICD-10-CM | POA: Insufficient documentation

## 2024-06-10 DIAGNOSIS — Z122 Encounter for screening for malignant neoplasm of respiratory organs: Secondary | ICD-10-CM | POA: Insufficient documentation

## 2024-06-10 DIAGNOSIS — M47894 Other spondylosis, thoracic region: Secondary | ICD-10-CM | POA: Insufficient documentation

## 2024-06-10 DIAGNOSIS — R5383 Other fatigue: Secondary | ICD-10-CM | POA: Diagnosis not present

## 2024-06-10 DIAGNOSIS — M129 Arthropathy, unspecified: Secondary | ICD-10-CM | POA: Insufficient documentation

## 2024-06-10 DIAGNOSIS — Z79899 Other long term (current) drug therapy: Secondary | ICD-10-CM | POA: Insufficient documentation

## 2024-06-10 DIAGNOSIS — J439 Emphysema, unspecified: Secondary | ICD-10-CM | POA: Insufficient documentation

## 2024-06-10 DIAGNOSIS — I700 Atherosclerosis of aorta: Secondary | ICD-10-CM | POA: Insufficient documentation

## 2024-06-10 DIAGNOSIS — E7800 Pure hypercholesterolemia, unspecified: Secondary | ICD-10-CM | POA: Diagnosis not present

## 2024-06-10 DIAGNOSIS — F17210 Nicotine dependence, cigarettes, uncomplicated: Secondary | ICD-10-CM | POA: Diagnosis not present

## 2024-06-10 DIAGNOSIS — E559 Vitamin D deficiency, unspecified: Secondary | ICD-10-CM | POA: Diagnosis not present

## 2024-06-10 DIAGNOSIS — I2510 Atherosclerotic heart disease of native coronary artery without angina pectoris: Secondary | ICD-10-CM | POA: Diagnosis not present

## 2024-06-10 DIAGNOSIS — E0590 Thyrotoxicosis, unspecified without thyrotoxic crisis or storm: Secondary | ICD-10-CM | POA: Diagnosis not present

## 2024-06-10 DIAGNOSIS — N281 Cyst of kidney, acquired: Secondary | ICD-10-CM | POA: Diagnosis not present

## 2024-06-10 DIAGNOSIS — E875 Hyperkalemia: Secondary | ICD-10-CM | POA: Diagnosis not present
